# Patient Record
Sex: Male | Born: 1950
Health system: Southern US, Community
[De-identification: ages and names within clinical notes are randomized; demographics above are authoritative.]

## PROBLEM LIST (undated history)

## (undated) DIAGNOSIS — IMO0002 Reserved for concepts with insufficient information to code with codable children: Secondary | ICD-10-CM

## (undated) DIAGNOSIS — K219 Gastro-esophageal reflux disease without esophagitis: Secondary | ICD-10-CM

## (undated) DIAGNOSIS — M25552 Pain in left hip: Secondary | ICD-10-CM

## (undated) DIAGNOSIS — Z87442 Personal history of urinary calculi: Secondary | ICD-10-CM

## (undated) DIAGNOSIS — I700 Atherosclerosis of aorta: Secondary | ICD-10-CM

## (undated) DIAGNOSIS — K469 Unspecified abdominal hernia without obstruction or gangrene: Secondary | ICD-10-CM

## (undated) DIAGNOSIS — I10 Essential (primary) hypertension: Secondary | ICD-10-CM

## (undated) DIAGNOSIS — E785 Hyperlipidemia, unspecified: Secondary | ICD-10-CM

## (undated) DIAGNOSIS — N189 Chronic kidney disease, unspecified: Secondary | ICD-10-CM

## (undated) DIAGNOSIS — J45909 Unspecified asthma, uncomplicated: Secondary | ICD-10-CM

## (undated) DIAGNOSIS — Z8719 Personal history of other diseases of the digestive system: Secondary | ICD-10-CM

## (undated) DIAGNOSIS — K759 Inflammatory liver disease, unspecified: Secondary | ICD-10-CM

## (undated) DIAGNOSIS — I728 Aneurysm of other specified arteries: Secondary | ICD-10-CM

## (undated) HISTORY — PX: TONSILLECTOMY: SUR1361

## (undated) HISTORY — DX: Chronic kidney disease, unspecified: N18.9

## (undated) HISTORY — PX: UPPER GI ENDOSCOPY: SHX6162

## (undated) HISTORY — PX: OTHER SURGICAL HISTORY: SHX169

## (undated) HISTORY — PX: WISDOM TOOTH EXTRACTION: SHX21

## (undated) HISTORY — DX: Hyperlipidemia, unspecified: E78.5

## (undated) HISTORY — PX: UMBILICAL HERNIA REPAIR: SHX196

## (undated) HISTORY — PX: KNEE SURGERY: SHX244

## (undated) HISTORY — DX: Essential (primary) hypertension: I10

---

## 2015-12-07 DIAGNOSIS — E785 Hyperlipidemia, unspecified: Secondary | ICD-10-CM | POA: Diagnosis not present

## 2015-12-07 DIAGNOSIS — I129 Hypertensive chronic kidney disease with stage 1 through stage 4 chronic kidney disease, or unspecified chronic kidney disease: Secondary | ICD-10-CM | POA: Diagnosis not present

## 2015-12-07 DIAGNOSIS — N182 Chronic kidney disease, stage 2 (mild): Secondary | ICD-10-CM | POA: Diagnosis not present

## 2015-12-07 DIAGNOSIS — M25551 Pain in right hip: Secondary | ICD-10-CM | POA: Diagnosis not present

## 2015-12-07 DIAGNOSIS — J449 Chronic obstructive pulmonary disease, unspecified: Secondary | ICD-10-CM | POA: Diagnosis not present

## 2016-01-31 DIAGNOSIS — Z0000 Encounter for general adult medical examination without abnormal findings: Secondary | ICD-10-CM | POA: Diagnosis not present

## 2016-01-31 DIAGNOSIS — Z23 Encounter for immunization: Secondary | ICD-10-CM | POA: Diagnosis not present

## 2016-01-31 DIAGNOSIS — Z125 Encounter for screening for malignant neoplasm of prostate: Secondary | ICD-10-CM | POA: Diagnosis not present

## 2016-01-31 DIAGNOSIS — E785 Hyperlipidemia, unspecified: Secondary | ICD-10-CM | POA: Diagnosis not present

## 2016-01-31 DIAGNOSIS — I129 Hypertensive chronic kidney disease with stage 1 through stage 4 chronic kidney disease, or unspecified chronic kidney disease: Secondary | ICD-10-CM | POA: Diagnosis not present

## 2016-02-07 DIAGNOSIS — E785 Hyperlipidemia, unspecified: Secondary | ICD-10-CM | POA: Diagnosis not present

## 2016-02-07 DIAGNOSIS — J449 Chronic obstructive pulmonary disease, unspecified: Secondary | ICD-10-CM | POA: Diagnosis not present

## 2016-02-07 DIAGNOSIS — I129 Hypertensive chronic kidney disease with stage 1 through stage 4 chronic kidney disease, or unspecified chronic kidney disease: Secondary | ICD-10-CM | POA: Diagnosis not present

## 2016-02-07 DIAGNOSIS — N182 Chronic kidney disease, stage 2 (mild): Secondary | ICD-10-CM | POA: Diagnosis not present

## 2016-02-07 DIAGNOSIS — F17211 Nicotine dependence, cigarettes, in remission: Secondary | ICD-10-CM | POA: Diagnosis not present

## 2016-05-01 DIAGNOSIS — I129 Hypertensive chronic kidney disease with stage 1 through stage 4 chronic kidney disease, or unspecified chronic kidney disease: Secondary | ICD-10-CM | POA: Diagnosis not present

## 2016-05-01 DIAGNOSIS — E785 Hyperlipidemia, unspecified: Secondary | ICD-10-CM | POA: Diagnosis not present

## 2016-05-01 DIAGNOSIS — R7309 Other abnormal glucose: Secondary | ICD-10-CM | POA: Diagnosis not present

## 2016-05-08 DIAGNOSIS — J449 Chronic obstructive pulmonary disease, unspecified: Secondary | ICD-10-CM | POA: Diagnosis not present

## 2016-05-08 DIAGNOSIS — E785 Hyperlipidemia, unspecified: Secondary | ICD-10-CM | POA: Diagnosis not present

## 2016-05-08 DIAGNOSIS — F17211 Nicotine dependence, cigarettes, in remission: Secondary | ICD-10-CM | POA: Diagnosis not present

## 2016-05-08 DIAGNOSIS — N182 Chronic kidney disease, stage 2 (mild): Secondary | ICD-10-CM | POA: Diagnosis not present

## 2016-05-08 DIAGNOSIS — I129 Hypertensive chronic kidney disease with stage 1 through stage 4 chronic kidney disease, or unspecified chronic kidney disease: Secondary | ICD-10-CM | POA: Diagnosis not present

## 2016-08-08 DIAGNOSIS — E785 Hyperlipidemia, unspecified: Secondary | ICD-10-CM | POA: Diagnosis not present

## 2016-08-08 DIAGNOSIS — I129 Hypertensive chronic kidney disease with stage 1 through stage 4 chronic kidney disease, or unspecified chronic kidney disease: Secondary | ICD-10-CM | POA: Diagnosis not present

## 2016-08-20 DIAGNOSIS — E785 Hyperlipidemia, unspecified: Secondary | ICD-10-CM | POA: Diagnosis not present

## 2016-08-20 DIAGNOSIS — J449 Chronic obstructive pulmonary disease, unspecified: Secondary | ICD-10-CM | POA: Diagnosis not present

## 2016-08-20 DIAGNOSIS — I129 Hypertensive chronic kidney disease with stage 1 through stage 4 chronic kidney disease, or unspecified chronic kidney disease: Secondary | ICD-10-CM | POA: Diagnosis not present

## 2016-08-20 DIAGNOSIS — N182 Chronic kidney disease, stage 2 (mild): Secondary | ICD-10-CM | POA: Diagnosis not present

## 2016-08-20 DIAGNOSIS — F17211 Nicotine dependence, cigarettes, in remission: Secondary | ICD-10-CM | POA: Diagnosis not present

## 2016-11-13 DIAGNOSIS — I129 Hypertensive chronic kidney disease with stage 1 through stage 4 chronic kidney disease, or unspecified chronic kidney disease: Secondary | ICD-10-CM | POA: Diagnosis not present

## 2016-11-20 DIAGNOSIS — J449 Chronic obstructive pulmonary disease, unspecified: Secondary | ICD-10-CM | POA: Diagnosis not present

## 2016-11-20 DIAGNOSIS — E785 Hyperlipidemia, unspecified: Secondary | ICD-10-CM | POA: Diagnosis not present

## 2016-11-20 DIAGNOSIS — I129 Hypertensive chronic kidney disease with stage 1 through stage 4 chronic kidney disease, or unspecified chronic kidney disease: Secondary | ICD-10-CM | POA: Diagnosis not present

## 2016-11-20 DIAGNOSIS — K219 Gastro-esophageal reflux disease without esophagitis: Secondary | ICD-10-CM | POA: Diagnosis not present

## 2016-11-20 DIAGNOSIS — Z23 Encounter for immunization: Secondary | ICD-10-CM | POA: Diagnosis not present

## 2017-06-05 DIAGNOSIS — E785 Hyperlipidemia, unspecified: Secondary | ICD-10-CM | POA: Diagnosis not present

## 2017-06-05 DIAGNOSIS — Z0000 Encounter for general adult medical examination without abnormal findings: Secondary | ICD-10-CM | POA: Diagnosis not present

## 2017-06-05 DIAGNOSIS — I129 Hypertensive chronic kidney disease with stage 1 through stage 4 chronic kidney disease, or unspecified chronic kidney disease: Secondary | ICD-10-CM | POA: Diagnosis not present

## 2017-06-05 DIAGNOSIS — Z129 Encounter for screening for malignant neoplasm, site unspecified: Secondary | ICD-10-CM | POA: Diagnosis not present

## 2017-06-11 DIAGNOSIS — K219 Gastro-esophageal reflux disease without esophagitis: Secondary | ICD-10-CM | POA: Diagnosis not present

## 2017-06-11 DIAGNOSIS — J449 Chronic obstructive pulmonary disease, unspecified: Secondary | ICD-10-CM | POA: Diagnosis not present

## 2017-06-11 DIAGNOSIS — N182 Chronic kidney disease, stage 2 (mild): Secondary | ICD-10-CM | POA: Diagnosis not present

## 2017-06-11 DIAGNOSIS — E785 Hyperlipidemia, unspecified: Secondary | ICD-10-CM | POA: Diagnosis not present

## 2017-09-30 DIAGNOSIS — Z23 Encounter for immunization: Secondary | ICD-10-CM | POA: Diagnosis not present

## 2017-11-11 DIAGNOSIS — D485 Neoplasm of uncertain behavior of skin: Secondary | ICD-10-CM | POA: Diagnosis not present

## 2017-11-11 DIAGNOSIS — L918 Other hypertrophic disorders of the skin: Secondary | ICD-10-CM | POA: Diagnosis not present

## 2017-11-11 DIAGNOSIS — L853 Xerosis cutis: Secondary | ICD-10-CM | POA: Diagnosis not present

## 2017-11-11 DIAGNOSIS — C44329 Squamous cell carcinoma of skin of other parts of face: Secondary | ICD-10-CM | POA: Diagnosis not present

## 2017-11-11 DIAGNOSIS — L821 Other seborrheic keratosis: Secondary | ICD-10-CM | POA: Diagnosis not present

## 2017-11-11 DIAGNOSIS — L570 Actinic keratosis: Secondary | ICD-10-CM | POA: Diagnosis not present

## 2017-11-11 DIAGNOSIS — D1801 Hemangioma of skin and subcutaneous tissue: Secondary | ICD-10-CM | POA: Diagnosis not present

## 2017-12-04 DIAGNOSIS — E785 Hyperlipidemia, unspecified: Secondary | ICD-10-CM | POA: Diagnosis not present

## 2017-12-04 DIAGNOSIS — N182 Chronic kidney disease, stage 2 (mild): Secondary | ICD-10-CM | POA: Diagnosis not present

## 2017-12-06 DIAGNOSIS — Z85828 Personal history of other malignant neoplasm of skin: Secondary | ICD-10-CM | POA: Diagnosis not present

## 2017-12-06 DIAGNOSIS — C44329 Squamous cell carcinoma of skin of other parts of face: Secondary | ICD-10-CM | POA: Diagnosis not present

## 2017-12-06 HISTORY — PX: SQUAMOUS CELL CARCINOMA EXCISION: SHX2433

## 2017-12-11 ENCOUNTER — Encounter: Payer: Self-pay | Admitting: Gastroenterology

## 2017-12-11 DIAGNOSIS — K649 Unspecified hemorrhoids: Secondary | ICD-10-CM | POA: Diagnosis not present

## 2017-12-11 DIAGNOSIS — I10 Essential (primary) hypertension: Secondary | ICD-10-CM | POA: Diagnosis not present

## 2017-12-11 DIAGNOSIS — E7800 Pure hypercholesterolemia, unspecified: Secondary | ICD-10-CM | POA: Diagnosis not present

## 2017-12-11 DIAGNOSIS — Z23 Encounter for immunization: Secondary | ICD-10-CM | POA: Diagnosis not present

## 2017-12-11 DIAGNOSIS — R7301 Impaired fasting glucose: Secondary | ICD-10-CM | POA: Diagnosis not present

## 2017-12-11 DIAGNOSIS — Z7189 Other specified counseling: Secondary | ICD-10-CM | POA: Diagnosis not present

## 2018-01-10 DIAGNOSIS — Z23 Encounter for immunization: Secondary | ICD-10-CM | POA: Diagnosis not present

## 2018-01-21 DIAGNOSIS — J019 Acute sinusitis, unspecified: Secondary | ICD-10-CM | POA: Diagnosis not present

## 2018-01-21 NOTE — Progress Notes (Signed)
New patient paperwork 

## 2018-01-29 ENCOUNTER — Encounter: Payer: Self-pay | Admitting: Gastroenterology

## 2018-01-29 ENCOUNTER — Ambulatory Visit (INDEPENDENT_AMBULATORY_CARE_PROVIDER_SITE_OTHER): Payer: Medicare Other | Admitting: Gastroenterology

## 2018-01-29 ENCOUNTER — Encounter (INDEPENDENT_AMBULATORY_CARE_PROVIDER_SITE_OTHER): Payer: Self-pay

## 2018-01-29 VITALS — BP 124/80 | HR 72 | Ht 71.75 in | Wt 261.2 lb

## 2018-01-29 DIAGNOSIS — R194 Change in bowel habit: Secondary | ICD-10-CM

## 2018-01-29 DIAGNOSIS — K625 Hemorrhage of anus and rectum: Secondary | ICD-10-CM | POA: Diagnosis not present

## 2018-01-29 DIAGNOSIS — K648 Other hemorrhoids: Secondary | ICD-10-CM

## 2018-01-29 DIAGNOSIS — K219 Gastro-esophageal reflux disease without esophagitis: Secondary | ICD-10-CM

## 2018-01-29 MED ORDER — NA SULFATE-K SULFATE-MG SULF 17.5-3.13-1.6 GM/177ML PO SOLN: 1.0000 | Freq: Once | ORAL | 0 refills | Status: AC

## 2018-01-29 NOTE — Progress Notes (Signed)
HPI :  67 y/o male with a history of reactive airway disease / COPD, CKD, HTN, HLD,, GERD, referred here by Deland Pretty MD for new patient visit for blood in the stools.   The patient states he had a thrombosed external hemorrhoid which was extremely painful and ruptured blood, roughly 2-1/2 years ago.  Since that time he states he has been having intermittent rectal bleeding.  He states there is no specific pattern to the bleeding, it can occur every day for a few days and then he will not have it for a period of time.  He denies any clear triggers for it.  He denies any perianal pain or discomfort with this.  He states his bowels were previously regular, but now he has irregular bowel habits, he states he does not feel he can completely evacuate himself and has smaller volume stools, multiple times per day.  The bowel symptoms have been going on for several months.  He has some occasional right lower quadrant pain at times but is fleeting and thought to be related to his hip.  He denies any weight loss.  He denies any family history of colon cancer.  He did have a colonoscopy at age 89 which she states was normal, and then had another colonoscopy at age 74 which she states was also normal.  Both of these were done in the [place] area.  No records are available for this.  He does have a history of reflux for which he takes Nexium 20 mg once per day.  He states this controls his reflux very well and he does not have any dysphasia or breakthrough symptoms he did have a prior EGD in his 75s which he states was normal, he has no reported history of Barrett's esophagus.   Past Medical History:  Diagnosis Date  . COPD (chronic obstructive pulmonary disease) ([place])   . CRF (chronic renal failure)    Stage 2  . Hyperlipidemia   . Hypertension      Past Surgical History:  Procedure Laterality Date  . KNEE SURGERY Left    tumor removed  . SQUAMOUS CELL CARCINOMA EXCISION  12/06/2017   removal- left temple  . TONSILLECTOMY N/A   . UMBILICAL HERNIA REPAIR     Family History  Problem Relation Age of Onset  . Heart disease Father    Social History   Tobacco Use  . Smoking status: Former Research scientist (life sciences)  . Smokeless tobacco: Never Used  Substance Use Topics  . Alcohol use: Yes    Comment: 2 to 3 a week  . Drug use: No   Current Outpatient Medications  Medication Sig Dispense Refill  . albuterol (PROVENTIL HFA;VENTOLIN HFA) 108 (90 Base) MCG/ACT inhaler Inhale 2 puffs into the lungs every 4 (four) hours as needed for wheezing or shortness of breath.    . doxycycline (DORYX) 100 MG EC tablet Take 100 mg by mouth daily.    Marland Kitchen esomeprazole (NEXIUM 24HR) 20 MG capsule Take 20 mg by mouth daily at 12 noon.    . ezetimibe (ZETIA) 10 MG tablet Take 10 mg by mouth daily.    . hydrochlorothiazide (HYDRODIURIL) 25 MG tablet Take 25 mg by mouth daily.    . naproxen sodium (ALEVE) 220 MG tablet Take 220 mg by mouth as needed.     No current facility-administered medications for this visit.    Allergies  Allergen Reactions  . Atorvastatin Calcium Anaphylaxis    Memory loss  .  Influenza Vac Split Quad   . Penicillin V Potassium      Review of Systems: All systems reviewed and negative except where noted in HPI.   Labs from primary care obtained:  12/04/2017 - Hgb 16.7, MCV 94, WBC 6.9, plt 222 Normal LFTS and renal function  Physical Exam: BP 124/80   Pulse 72   Ht 5' 11.75" (1.822 m)   Wt 261 lb 4 oz (118.5 kg)   BMI 35.68 kg/m  Constitutional: Pleasant,well-developed, male in no acute distress. HEENT: Normocephalic and atraumatic. Conjunctivae are normal. No scleral icterus. Neck supple.  Cardiovascular: Normal rate, regular rhythm.  Pulmonary/chest: Effort normal and breath sounds normal. No wheezing, rales or rhonchi. Abdominal: Soft, nondistended, nontender.There are no masses palpable. No hepatomegaly. DRE / Anoscopy - no mass lesions, inflamed LL internal  hemorrhoids Extremities: no edema Lymphadenopathy: No cervical adenopathy noted. Neurological: Alert and oriented to person place and time. Skin: Skin is warm and dry. No rashes noted. Psychiatric: Normal mood and affect. Behavior is normal.   ASSESSMENT AND PLAN: 67 year old male with history as outlined above, referred for intermittent rectal bleeding associated with change in bowel habits.  His DRE and anoscopy shows an inflamed moderate sized left lateral hemorrhoid which I suspect is the likely etiology for his bleeding.  However given his reported change in bowel habits I think a colonoscopy is reasonable at this time given his last exam was 7 years ago, to ensure no bleeding polyp or mass lesion.  If the colonoscopy is unrevealing for another cause for his symptoms then I offered him a hemorrhoid banding to treat the hemorrhoids.  Following a discussion of the risks and benefits of colonoscopy he wanted to proceed as outlined.  I did offer to band the hemorrhoid today, but we elected to await colonoscopy results as hemorrhoids are very common and I want to exclude other etiologies first.  He agreed.  Recommend he is a daily fiber supplement in the interim to regulate his bowels.  He has no anemia on review of labs which is reassuring.  He otherwise will continue current dose of Nexium as it controls symptoms well, he has no history of Barrett's esophagus  Florence Cellar, MD [facility] Pager [phone]  CC: Deland Pretty, MD

## 2018-01-29 NOTE — Patient Instructions (Addendum)
If you are age 68 or older, your body mass index should be between 23-30. Your Body mass index is 35.68 kg/m. If this is out of the aforementioned range listed, please consider follow up with your Primary Care Provider.  If you are age 57 or younger, your body mass index should be between 19-25. Your Body mass index is 35.68 kg/m. If this is out of the aformentioned range listed, please consider follow up with your Primary Care Provider.   You have been scheduled for a colonoscopy. Please follow written instructions given to you at your visit today.  Please pick up your prep supplies at the pharmacy within the next 1-3 days. If you use inhalers (even only as needed), please bring them with you on the day of your procedure. Your physician has requested that you go to www.startemmi.com and enter the access code given to you at your visit today. This web site gives a general overview about your procedure. However, you should still follow specific instructions given to you by our office regarding your preparation for the procedure.  Please begin taking a daily fiber supplement.   We will try to obtain your lab records from Dr. Thressa Sheller.  Thank you for entrusting me with your care and for choosing [facility], Dr. Estancia Cellar

## 2018-03-06 ENCOUNTER — Encounter: Payer: Self-pay | Admitting: Gastroenterology

## 2018-03-06 ENCOUNTER — Ambulatory Visit (AMBULATORY_SURGERY_CENTER): Payer: Medicare Other | Admitting: Gastroenterology

## 2018-03-06 VITALS — BP 100/64 | HR 82 | Temp 97.3°F | Resp 13 | Ht 71.0 in | Wt 261.0 lb

## 2018-03-06 DIAGNOSIS — D120 Benign neoplasm of cecum: Secondary | ICD-10-CM | POA: Diagnosis not present

## 2018-03-06 DIAGNOSIS — D126 Benign neoplasm of colon, unspecified: Secondary | ICD-10-CM

## 2018-03-06 DIAGNOSIS — R194 Change in bowel habit: Secondary | ICD-10-CM | POA: Diagnosis not present

## 2018-03-06 DIAGNOSIS — K635 Polyp of colon: Secondary | ICD-10-CM

## 2018-03-06 DIAGNOSIS — D125 Benign neoplasm of sigmoid colon: Secondary | ICD-10-CM | POA: Diagnosis not present

## 2018-03-06 DIAGNOSIS — J45909 Unspecified asthma, uncomplicated: Secondary | ICD-10-CM | POA: Diagnosis not present

## 2018-03-06 DIAGNOSIS — K625 Hemorrhage of anus and rectum: Secondary | ICD-10-CM | POA: Diagnosis present

## 2018-03-06 HISTORY — PX: COLONOSCOPY W/ POLYPECTOMY: SHX1380

## 2018-03-06 MED ORDER — SODIUM CHLORIDE 0.9 % IV SOLN: 500.0000 mL | Freq: Once | INTRAVENOUS | Status: AC

## 2018-03-06 NOTE — Patient Instructions (Signed)
Impression/recommendations:  Polyps (handout given) Diverticulosis (handout given) Hemorrhoids (handout given)  YOU HAD AN ENDOSCOPIC PROCEDURE TODAY AT THE [facility]:   Refer to the procedure report that was given to you for any specific questions about what was found during the examination.  If the procedure report does not answer your questions, please call your gastroenterologist to clarify.  If you requested that your care partner not be given the details of your procedure findings, then the procedure report has been included in a sealed envelope for you to review at your convenience later.  YOU SHOULD EXPECT: Some feelings of bloating in the abdomen. Passage of more gas than usual.  Walking can help get rid of the air that was put into your GI tract during the procedure and reduce the bloating. If you had a lower endoscopy (such as a colonoscopy or flexible sigmoidoscopy) you may notice spotting of blood in your stool or on the toilet paper. If you underwent a bowel prep for your procedure, you may not have a normal bowel movement for a few days.  Please Note:  You might notice some irritation and congestion in your nose or some drainage.  This is from the oxygen used during your procedure.  There is no need for concern and it should clear up in a day or so.  SYMPTOMS TO REPORT IMMEDIATELY:   Following lower endoscopy (colonoscopy or flexible sigmoidoscopy):  Excessive amounts of blood in the stool  Significant tenderness or worsening of abdominal pains  Swelling of the abdomen that is new, acute  Fever of 100F or higher  For urgent or emergent issues, a gastroenterologist can be reached at any hour by calling [phone].   DIET:  We do recommend a small meal at first, but then you may proceed to your regular diet.  Drink plenty of fluids but you should avoid alcoholic beverages for 24 hours.  ACTIVITY:  You should plan to take it easy for the rest of today and you  should NOT DRIVE or use heavy machinery until tomorrow (because of the sedation medicines used during the test).    FOLLOW UP: Our staff will call the number listed on your records the next business day following your procedure to check on you and address any questions or concerns that you may have regarding the information given to you following your procedure. If we do not reach you, we will leave a message.  However, if you are feeling well and you are not experiencing any problems, there is no need to return our call.  We will assume that you have returned to your regular daily activities without incident.  If any biopsies were taken you will be contacted by phone or by letter within the next 1-3 weeks.  Please call us at [phone] if you have not heard about the biopsies in 3 weeks.    SIGNATURES/CONFIDENTIALITY: You and/or your care partner have signed paperwork which will be entered into your electronic medical record.  These signatures attest to the fact that that the information above on your After Visit Summary has been reviewed and is understood.  Full responsibility of the confidentiality of this discharge information lies with you and/or your care-partner. 

## 2018-03-06 NOTE — Progress Notes (Signed)
Called to room to assist during endoscopic procedure.  Patient ID and intended procedure confirmed with present staff. Received instructions for my participation in the procedure from the performing physician.  

## 2018-03-06 NOTE — Op Note (Signed)
Center Patient Name: Theodore Brooks Procedure Date: 03/06/2018 10:57 AM MRN: [id number] Endoscopist: Remo Lipps P. Armbruster MD, MD Age: 67 Referring MD:  Date of Birth: Feb 02, 1951 Gender: Male Account #: [id number] Procedure:                Colonoscopy Indications:              Hematochezia, Change in bowel habits Medicines:                Monitored Anesthesia Care Procedure:                Pre-Anesthesia Assessment:                           - Prior to the procedure, a History and Physical                            was performed, and patient medications and                            allergies were reviewed. The patient's tolerance of                            previous anesthesia was also reviewed. The risks                            and benefits of the procedure and the sedation                            options and risks were discussed with the patient.                            All questions were answered, and informed consent                            was obtained. Prior Anticoagulants: The patient has                            taken no previous anticoagulant or antiplatelet                            agents. ASA Grade Assessment: III - A patient with                            severe systemic disease. After reviewing the risks                            and benefits, the patient was deemed in                            satisfactory condition to undergo the procedure.                           After obtaining informed consent, the colonoscope  was passed under direct vision. Throughout the                            procedure, the patient's blood pressure, pulse, and                            oxygen saturations were monitored continuously. The                            Colonoscope was introduced through the anus and                            advanced to the the terminal ileum, with                            identification of the  appendiceal orifice and IC                            valve. The colonoscopy was performed without                            difficulty. The patient tolerated the procedure                            well. The quality of the bowel preparation was                            good. The terminal ileum, ileocecal valve,                            appendiceal orifice, and rectum were photographed. Scope In: 11:11:23 AM Scope Out: 11:27:13 AM Scope Withdrawal Time: 0 hours 13 minutes 56 seconds  Total Procedure Duration: 0 hours 15 minutes 50 seconds  Findings:                 The perianal and digital rectal examinations were                            normal.                           The terminal ileum appeared normal.                           Two sessile polyps were found in the cecum. The                            polyps were 3 to 4 mm in size. These polyps were                            removed with a cold snare. Resection and retrieval                            were complete.  A 5 mm polyp was found in the sigmoid colon. The                            polyp was sessile. The polyp was removed with a                            cold snare. Resection and retrieval were complete.                           Scattered medium-mouthed diverticula were found in                            the transverse colon and left colon.                           Internal hemorrhoids were found during                            retroflexion. The hemorrhoids were moderate.                           The exam was otherwise without abnormality. Of                            note, the IC valve was normal but photo                            accidentally not obtained. Complications:            No immediate complications. Estimated blood loss:                            Minimal. Estimated Blood Loss:     Estimated blood loss was minimal. Impression:               - The examined portion of  the ileum was normal.                           - Two 3 to 4 mm polyps in the cecum, removed with a                            cold snare. Resected and retrieved.                           - One 5 mm polyp in the sigmoid colon, removed with                            a cold snare. Resected and retrieved.                           - Diverticulosis in the transverse colon and in the                            left colon.                           -  Internal hemorrhoids.                           - The examination was otherwise normal.                           Hemorrhoids are the cause of the bleeding symptoms Recommendation:           - Patient has a contact number available for                            emergencies. The signs and symptoms of potential                            delayed complications were discussed with the                            patient. Return to normal activities tomorrow.                            Written discharge instructions were provided to the                            patient.                           - Resume previous diet.                           - Continue present medications.                           - Await pathology results.                           - Repeat colonoscopy is recommended for                            surveillance. The colonoscopy date will be                            determined after pathology results from today's                            exam become available for review.                           - Consideration for hemorrhoid banding if symptoms                            of bleeding persist. Remo Lipps P. Armbruster MD, MD 03/06/2018 11:33:02 AM This report has been signed electronically.

## 2018-03-06 NOTE — Progress Notes (Signed)
Pt's states no medical or surgical changes since previsit or office visit. 

## 2018-03-06 NOTE — Progress Notes (Signed)
Report given to PACU, vss 

## 2018-03-07 ENCOUNTER — Telehealth: Payer: Self-pay

## 2018-03-07 ENCOUNTER — Telehealth: Payer: Self-pay | Admitting: *Deleted

## 2018-03-07 ENCOUNTER — Other Ambulatory Visit: Payer: Self-pay

## 2018-03-07 DIAGNOSIS — R1031 Right lower quadrant pain: Secondary | ICD-10-CM

## 2018-03-07 NOTE — Telephone Encounter (Signed)
Patient advised, scheduled for CT and lab work. He will come by and pick up prep/instructions early next week and get labs done. He wants to wait on hemorrhoid banding and will call back at a later date.

## 2018-03-07 NOTE — Telephone Encounter (Signed)
-----   Message from Yetta Flock, MD sent at 03/06/2018 12:16 PM EST ----- Regarding: last one - follow up testing Almyra Free can you help coordinate the following for this patient, I saw him during colonoscopy today:  - CT abdomen / pelvis with contrast for RLQ pain. He will probably need a BMET on file to ensure normal - Can you schedule him for a hemorrhoid banding as well.   Thanks Dr. Loni Muse

## 2018-03-07 NOTE — Telephone Encounter (Signed)
  Follow up Call-  Call back number 03/06/2018  Post procedure Call Back phone  # [phone]  Permission to leave phone message Yes     Patient questions:  Do you have a fever, pain , or abdominal swelling? No. Pain Score  0 *  Have you tolerated food without any problems? Yes.    Have you been able to return to your normal activities? Yes.    Do you have any questions about your discharge instructions: Diet   No. Medications  No. Follow up visit  No.  Do you have questions or concerns about your Care? No.  Actions: * If pain score is 4 or above: No action needed, pain <4.

## 2018-03-10 ENCOUNTER — Other Ambulatory Visit (INDEPENDENT_AMBULATORY_CARE_PROVIDER_SITE_OTHER): Payer: Medicare Other

## 2018-03-10 DIAGNOSIS — R1031 Right lower quadrant pain: Secondary | ICD-10-CM | POA: Diagnosis not present

## 2018-03-10 LAB — BASIC METABOLIC PANEL
BUN: 16 mg/dL (ref 6–23)
CO2: 26 mEq/L (ref 19–32)
Calcium: 9.6 mg/dL (ref 8.4–10.5)
Chloride: 100 mEq/L (ref 96–112)
Creatinine, Ser: 1.07 mg/dL (ref 0.40–1.50)
GFR: 73.14 mL/min (ref >60.00)
GLUCOSE: 93 mg/dL (ref 70–99)
POTASSIUM: 3.7 meq/L (ref 3.5–5.1)
Sodium: 136 mEq/L (ref 135–145)

## 2018-03-12 ENCOUNTER — Encounter: Payer: Self-pay | Admitting: Gastroenterology

## 2018-03-12 ENCOUNTER — Telehealth: Payer: Self-pay | Admitting: Gastroenterology

## 2018-03-12 NOTE — Telephone Encounter (Signed)
Called patient back and gave him the number to radiology scheduling so he can get CT rescheduled at his convenience. He has already done lab work and picked up the prep/instructions.

## 2018-03-14 ENCOUNTER — Ambulatory Visit (HOSPITAL_COMMUNITY): Payer: Medicare Other

## 2018-03-17 ENCOUNTER — Encounter (HOSPITAL_COMMUNITY): Payer: Self-pay

## 2018-03-17 ENCOUNTER — Ambulatory Visit (HOSPITAL_COMMUNITY)
Admission: RE | Admit: 2018-03-17 | Discharge: 2018-03-17 | Disposition: A | Discharge status: Home / Self Care | Payer: Medicare Other | Source: Ambulatory Visit | Attending: Gastroenterology | Admitting: Gastroenterology

## 2018-03-17 DIAGNOSIS — R9389 Abnormal findings on diagnostic imaging of other specified body structures: Secondary | ICD-10-CM | POA: Diagnosis not present

## 2018-03-17 DIAGNOSIS — I728 Aneurysm of other specified arteries: Secondary | ICD-10-CM | POA: Insufficient documentation

## 2018-03-17 DIAGNOSIS — I700 Atherosclerosis of aorta: Secondary | ICD-10-CM | POA: Insufficient documentation

## 2018-03-17 DIAGNOSIS — R1031 Right lower quadrant pain: Secondary | ICD-10-CM | POA: Diagnosis present

## 2018-03-17 DIAGNOSIS — K449 Diaphragmatic hernia without obstruction or gangrene: Secondary | ICD-10-CM | POA: Diagnosis not present

## 2018-03-17 DIAGNOSIS — K439 Ventral hernia without obstruction or gangrene: Secondary | ICD-10-CM | POA: Diagnosis not present

## 2018-03-17 HISTORY — DX: Aneurysm of other specified arteries: I72.8

## 2018-03-17 MED ORDER — IOPAMIDOL (ISOVUE-300) INJECTION 61%
INTRAVENOUS | Status: AC
  Administered 2018-03-17: 100 mL via INTRAVENOUS
  Filled 2018-03-17: qty 100

## 2018-03-17 MED ORDER — IOPAMIDOL (ISOVUE-300) INJECTION 61%
100.0000 mL | Freq: Once | INTRAVENOUS | Status: AC | PRN
  Administered 2018-03-17: 100 mL via INTRAVENOUS

## 2018-03-21 ENCOUNTER — Telehealth: Payer: Self-pay | Admitting: Gastroenterology

## 2018-03-21 NOTE — Telephone Encounter (Signed)
See result note. Referral faxed to [facility].

## 2018-04-07 ENCOUNTER — Telehealth: Payer: Self-pay | Admitting: Gastroenterology

## 2018-04-07 NOTE — Telephone Encounter (Signed)
Left message for [facility] to call back.

## 2018-04-07 NOTE — Telephone Encounter (Signed)
[facility] called back and state that pt will be seeing Dr. Lubertha South with [facility] 04/14/18@3 :10pm. Asked if I needed to contact pt regarding appt and was told I did not need to.

## 2018-04-07 NOTE — Telephone Encounter (Signed)
Patient states per CT results Dr.Armbruster was going to refer him to [facility].Patient states he has not heard from them and would like to know what to do.

## 2018-04-14 ENCOUNTER — Ambulatory Visit: Payer: Self-pay | Admitting: General Surgery

## 2018-04-14 DIAGNOSIS — K439 Ventral hernia without obstruction or gangrene: Secondary | ICD-10-CM | POA: Diagnosis not present

## 2018-04-14 NOTE — H&P (Signed)
History of Present Illness Theodore Brooks; 04/14/2018 3:25 PM) The patient is a 67 year old male who presents with an abdominal wall hernia. Referred by: Dr. [place] Cellar Chief Complaint: Hernia  Patient is a 67 year old male comes in with a history of hyperlipidemia, hypertension with a right lower quadrant abdominal pain. Patient states is "over several months. He states that the pain is classified as discomfort to the right lower quadrant. He states he notices it more when he is lifting for working out. He is working out consists of 2-5 miles walking. He states at that time he notices some tightening to the right lower quadrant area. Patient recently underwent colonoscopy which revealed hemorrhoids however no further findings. He underwent CT scan and was found to have a right-sided spigelian hernia with portion of the appendix and fat in the area. I did review the study personally.  Patient's had no previous abdominal surgeries aside from an open umbilical hernia repair with mesh.    Past Surgical History Sabino Gasser; 04/14/2018 3:07 PM) Tonsillectomy   Diagnostic Studies History Sabino Gasser; 04/14/2018 3:07 PM) Colonoscopy  within last year  Allergies Sabino Gasser; 04/14/2018 3:04 PM) Penicillins  Allergies Reconciled   Medication History Sabino Gasser; 04/14/2018 3:05 PM) Robafen AC (100-10MG /5ML Solution, Oral) Active. Doxycycline Hyclate (100MG  Capsule, Oral) Active. Ezetimibe (10MG  Tablet, Oral) Active. HydroCHLOROthiazide (25MG  Tablet, Oral) Active. Ciprofloxacin HCl (500MG  Tablet, Oral) Active. Medications Reconciled  Social History Sabino Gasser; 04/14/2018 3:07 PM) Alcohol use  Moderate alcohol use. Caffeine use  Coffee. Illicit drug use  Remotely quit drug use. Tobacco use  Former smoker.  Family History Sabino Gasser; 04/14/2018 3:07 PM) Diabetes Mellitus  Father. Heart Disease  Father.  Other Problems Sabino Gasser;  04/14/2018 3:07 PM) Asthma  Gastroesophageal Reflux Disease  High blood pressure  Hypercholesterolemia  Kidney Stone  Umbilical Hernia Repair     Review of Systems Theodore Brooks; 04/14/2018 3:22 PM) General Not Present- Appetite Loss, Chills, Fatigue, Fever, Night Sweats, Weight Gain and Weight Loss. Skin Not Present- Change in Wart/Mole, Dryness, Hives, Jaundice, New Lesions, Non-Healing Wounds, Rash and Ulcer. HEENT Present- Seasonal Allergies. Not Present- Earache, Hearing Loss, Hoarseness, Nose Bleed, Oral Ulcers, Ringing in the Ears, Sinus Pain, Sore Throat, Visual Disturbances, Wears glasses/contact lenses and Yellow Eyes. Respiratory Not Present- Bloody sputum, Chronic Cough, Difficulty Breathing, Snoring and Wheezing. Breast Not Present- Breast Mass, Breast Pain, Nipple Discharge and Skin Changes. Cardiovascular Not Present- Chest Pain, Difficulty Breathing Lying Down, Leg Cramps, Palpitations, Rapid Heart Rate, Shortness of Breath and Swelling of Extremities. Gastrointestinal Present- Bloody Stool and Hemorrhoids. Not Present- Abdominal Pain, Bloating, Change in Bowel Habits, Chronic diarrhea, Constipation, Difficulty Swallowing, Excessive gas, Gets full quickly at meals, Indigestion, Nausea, Rectal Pain and Vomiting. Male Genitourinary Not Present- Blood in Urine, Change in Urinary Stream, Frequency, Impotence, Nocturia, Painful Urination, Urgency and Urine Leakage. Musculoskeletal Not Present- Back Pain, Joint Pain, Joint Stiffness, Muscle Pain, Muscle Weakness and Swelling of Extremities. Neurological Not Present- Decreased Memory, Fainting, Headaches, Numbness, Seizures, Tingling, Tremor, Trouble walking and Weakness. Psychiatric Not Present- Anxiety, Bipolar, Change in Sleep Pattern, Depression, Fearful and Frequent crying. Endocrine Not Present- Cold Intolerance, Excessive Hunger, Hair Changes, Heat Intolerance, Hot flashes and New Diabetes. Hematology Not Present-  Blood Thinners, Easy Bruising, Excessive bleeding, Gland problems, HIV and Persistent Infections. All other systems negative  Vitals Sabino Gasser; 04/14/2018 3:06 PM) 04/14/2018 3:05 PM Weight: 256.25 lb Height: 72in Body Surface Area: 2.37 m Body Mass Index: 34.75 kg/m  Temp.:  98.30F(Oral)  Pulse: 102 (Regular)  BP: 132/80 (Sitting, Left Arm, Standard)       Physical Exam Theodore Brooks; 04/14/2018 3:25 PM) The physical exam findings are as follows: Note:Constitutional: No acute distress, conversant, appears stated age  Eyes: Anicteric sclerae, moist conjunctiva, no lid lag  Neck: No thyromegaly, trachea midline, no cervical lymphadenopathy  Lungs: Clear to auscultation biilaterally, normal respiratory effot  Cardiovascular: regular rate & rhythm, no murmurs, no peripheal edema, pedal pulses 2+  GI: Soft, no masses or hepatosplenomegaly, non-tender to palpation  MSK: Normal gait, no clubbing cyanosis, edema  Skin: No rashes, palpation reveals normal skin turgor  Psychiatric: Appropriate judgment and insight, oriented to person, place, and time  Abdomen Inspection Hernias - Ventral - Reducible(Right lower quadrant spigelian).    Assessment & Plan Theodore Brooks; 04/14/2018 3:26 PM) VENTRAL HERNIA WITHOUT OBSTRUCTION OR GANGRENE (K43.9) Impression: 67 year old male with a history of hypertension, hyperlipidemia, with a right lower quadrant spigelian hernia.  1. The patient will like to proceed to the operating room for laparoscopic/robotic spigelian hernia repair with mesh, possible appendectomy  2. I discussed with the patient the signs and symptoms of incarceration and strangulation and the need to proceed to the ER should they occur.  3. I discussed with the patient the risks and benefits of the procedure to include but not limited to: Infection, bleeding, damage to surrounding structures, possible need for further surgery, possible nerve  pain, and possible recurrence. The patient was understanding and wishes to proceed.

## 2018-05-23 ENCOUNTER — Encounter

## 2018-05-23 ENCOUNTER — Ambulatory Visit (INDEPENDENT_AMBULATORY_CARE_PROVIDER_SITE_OTHER): Payer: Medicare Other | Admitting: Vascular Surgery

## 2018-05-23 ENCOUNTER — Encounter: Payer: Self-pay | Admitting: Vascular Surgery

## 2018-05-23 VITALS — BP 123/82 | HR 76 | Resp 20 | Ht 71.0 in | Wt 261.8 lb

## 2018-05-23 DIAGNOSIS — I728 Aneurysm of other specified arteries: Secondary | ICD-10-CM

## 2018-05-23 NOTE — Progress Notes (Signed)
Patient ID: Theodore Brooks, male   DOB: 07/05/1951, 67 y.o.   MRN: [id number]  Reason for Consult: New Patient (Initial Visit) (eval aneurysm celiac trunk - Dr. Ralene Ok)   Referred by Merrilee Seashore, MD  Subjective:     HPI:  Theodore Brooks is a 67 y.o. male noted to have an abdominal hernia after lifting weights.  He is now scheduled for surgery on that.  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  He has a family history of a cerebral aneurysm but no abdominal or peripheral aneurysms.  He is a non-smoker.  Risk factors include hyperlipidemia hypertension.  He does not have any abdominal pain or limitation to eating.  He does not take blood thinners.  He has no constitutional symptoms.  Past Medical History:  Diagnosis Date  . COPD (chronic obstructive pulmonary disease) ([place])   . CRF (chronic renal failure)    Stage 2  . Hyperlipidemia   . Hypertension    Family History  Problem Relation Age of Onset  . Heart disease Father    Past Surgical History:  Procedure Laterality Date  . KNEE SURGERY Left    tumor removed  . SQUAMOUS CELL CARCINOMA EXCISION  12/06/2017   removal- left temple  . TONSILLECTOMY N/A   . UMBILICAL HERNIA REPAIR      Short Social History:  Social History   Tobacco Use  . Smoking status: Former Research scientist (life sciences)  . Smokeless tobacco: Never Used  Substance Use Topics  . Alcohol use: Yes    Comment: 2 to 3 a week    Allergies  Allergen Reactions  . Atorvastatin Calcium Anaphylaxis    Memory loss  . Influenza Vac Split Quad   . Penicillin V Potassium     Current Outpatient Medications  Medication Sig Dispense Refill  . albuterol (PROVENTIL HFA;VENTOLIN HFA) 108 (90 Base) MCG/ACT inhaler Inhale 2 puffs into the lungs every 4 (four) hours as needed for wheezing or shortness of breath.    . esomeprazole (NEXIUM 24HR) 20 MG capsule Take 20 mg by mouth daily at 12 noon.    . ezetimibe (ZETIA) 10 MG tablet  Take 10 mg by mouth daily.    . hydrochlorothiazide (HYDRODIURIL) 25 MG tablet Take 25 mg by mouth daily.    . naproxen sodium (ALEVE) 220 MG tablet Take 220 mg by mouth as needed.    . doxycycline (DORYX) 100 MG EC tablet Take 100 mg by mouth daily.     Current Facility-Administered Medications  Medication Dose Route Frequency Provider Last Rate Last Dose  . 0.9 %  sodium chloride infusion  500 mL Intravenous Once Armbruster, Carlota Raspberry, MD        Review of Systems  Constitutional:  Constitutional negative. HENT: HENT negative.  Eyes: Eyes negative.  Respiratory: Respiratory negative.  Cardiovascular: Cardiovascular negative.  GI: Gastrointestinal negative.  Skin: Skin negative.  Neurological: Neurological negative. Hematologic: Hematologic/lymphatic negative.  Psychiatric: Psychiatric negative.        Objective:  Objective   Vitals:   05/23/18 0857  BP: 123/82  Pulse: 76  Resp: 20  SpO2: 95%  Weight: 261 lb 12.8 oz (118.8 kg)  Height: 5\' 11"  (1.803 m)   Body mass index is 36.51 kg/m.  Physical Exam  Constitutional: He is oriented to person, place, and time. He appears well-developed.  HENT:  Head: Normocephalic.  Eyes: Pupils are equal, round, and reactive to light.  Neck: Normal range  of motion. Neck supple.  Cardiovascular: Normal rate.  Pulses:      Carotid pulses are 2+ on the right side, and 2+ on the left side.      Radial pulses are 2+ on the right side, and 2+ on the left side.       Femoral pulses are 2+ on the right side, and 2+ on the left side.      Popliteal pulses are 2+ on the right side, and 2+ on the left side.       Dorsalis pedis pulses are 2+ on the right side, and 2+ on the left side.  Pulmonary/Chest: Effort normal.  Abdominal: Soft. He exhibits no distension and no mass. There is no tenderness.  Musculoskeletal: Normal range of motion. He exhibits no edema.  Neurological: He is alert and oriented to person, place, and time.  Skin: Skin is  warm and dry.  Psychiatric: He has a normal mood and affect. His behavior is normal. Judgment and thought content normal.    Data: I reviewed his CT scan with him which demonstrates a 1.5 cm celiac artery aneurysm.     Assessment/Plan:     59 old male with abdominal wall hernia now scheduled for surgical repair.  He has a celiac artery aneurysm that is less than 2 cm the etiology and timing of which are unknown.  From a vascular standpoint he has no limitations to surgery or activity other than heavy lifting with straining which I counseled him against.  I will get him to follow-up in 6 months with a mesenteric duplex to get a second time point of the size he can see me if he has any other issues in the meantime.  We discussed the signs and symptoms of rupture ecchymosis which he demonstrates good understanding.  Short of this we will see him in 6 months.    Waynetta Sandy MD [facility]

## 2018-05-31 DIAGNOSIS — M25552 Pain in left hip: Secondary | ICD-10-CM

## 2018-05-31 HISTORY — DX: Pain in left hip: M25.552

## 2018-06-09 DIAGNOSIS — Z23 Encounter for immunization: Secondary | ICD-10-CM | POA: Diagnosis not present

## 2018-06-13 ENCOUNTER — Other Ambulatory Visit: Payer: Self-pay

## 2018-06-13 DIAGNOSIS — I728 Aneurysm of other specified arteries: Secondary | ICD-10-CM

## 2018-06-20 ENCOUNTER — Encounter (HOSPITAL_COMMUNITY): Payer: Self-pay

## 2018-06-20 DIAGNOSIS — Z0000 Encounter for general adult medical examination without abnormal findings: Secondary | ICD-10-CM | POA: Diagnosis not present

## 2018-06-20 DIAGNOSIS — E785 Hyperlipidemia, unspecified: Secondary | ICD-10-CM | POA: Diagnosis not present

## 2018-06-20 DIAGNOSIS — K219 Gastro-esophageal reflux disease without esophagitis: Secondary | ICD-10-CM | POA: Diagnosis not present

## 2018-06-20 DIAGNOSIS — E669 Obesity, unspecified: Secondary | ICD-10-CM | POA: Diagnosis not present

## 2018-06-20 DIAGNOSIS — I10 Essential (primary) hypertension: Secondary | ICD-10-CM | POA: Diagnosis not present

## 2018-06-20 NOTE — Patient Instructions (Addendum)
Your procedure is scheduled on: Friday, June 27, 2018   Surgery Time:  7:30AM-9:30AM   Report to [facility] Main  Entrance    Report to admitting at 5:30 AM   Call this number if you have problems the morning of surgery [phone]   Do NOT smoke after Midnight   Do not eat food:After Midnight.   Only clear liquids after midnight.       CLEAR LIQUID DIET   Foods Allowed                                                                     Foods Excluded  Coffee and tea, regular and decaf                             liquids that you cannot  Plain Jell-O in any flavor                                             see through such as: Fruit ices (not with fruit pulp)                                     milk, soups, orange juice  Iced Popsicles                                    All solid food Carbonated beverages, regular and diet                                    Cranberry, grape and apple juices Sports drinks like Gatorade Lightly seasoned clear broth or consume(fat free) Sugar, honey syrup  Sample Menu Breakfast                                Lunch                                     Supper Cranberry juice                    Beef broth                            Chicken broth Jell-O                                     Grape juice                           Apple juice Coffee or tea  Jell-O                                      Popsicle                                                Coffee or tea                        Coffee or tea   Nothing in your mouth after 4:30AM (NO GUM, CANDY, OR MINTS).    Please finish carbohydrate drink at 4:30AM morning of surgery   Take these medicines the morning of surgery with A SIP OF WATER: Nexium, Ezetimibe, Loratadine if needed   Bring Asthma Inhaler day of surgery                               You may not have any metal on your body including  jewelry, and body piercings             Do not wear  lotions, powders, perfumes/cologne, or deodorant                          Men may shave face and neck.   Do not bring valuables to the hospital. [place].   Contacts, dentures or bridgework may not be worn into surgery.    Patients discharged the day of surgery will not be allowed to drive home.   Special Instructions: Bring a copy of your healthcare power of attorney and living will documents         the day of surgery if you haven't scanned them in before.              Please read over the following fact sheets you were given:  [facility] - Preparing for Surgery Before surgery, you can play an important role.  Because skin is not sterile, your skin needs to be as free of germs as possible.  You can reduce the number of germs on your skin by washing with CHG (chlorahexidine gluconate) soap before surgery.  CHG is an antiseptic cleaner which kills germs and bonds with the skin to continue killing germs even after washing. Please DO NOT use if you have an allergy to CHG or antibacterial soaps.  If your skin becomes reddened/irritated stop using the CHG and inform your nurse when you arrive at Short Stay. Do not shave (including legs and underarms) for at least 48 hours prior to the first CHG shower.  You may shave your face/neck.  Please follow these instructions carefully:  1.  Shower with CHG Soap the night before surgery and the  morning of surgery.  2.  If you choose to wash your hair, wash your hair first as usual with your normal  shampoo.  3.  After you shampoo, rinse your hair and body thoroughly to remove the shampoo.  4.  Use CHG as you would any other liquid soap.  You can apply chg directly to the skin and wash.  Gently with a scrungie or clean washcloth.  5.  Apply the CHG Soap to your body ONLY FROM THE NECK DOWN.   Do   not use on face/ open                           Wound or open sores. Avoid  contact with eyes, ears mouth and   genitals (private parts).                       Wash face,  Genitals (private parts) with your normal soap.             6.  Wash thoroughly, paying special attention to the area where your    surgery  will be performed.  7.  Thoroughly rinse your body with warm water from the neck down.  8.  DO NOT shower/wash with your normal soap after using and rinsing off the CHG Soap.                9.  Pat yourself dry with a clean towel.            10.  Wear clean pajamas.            11.  Place clean sheets on your bed the night of your first shower and do not  sleep with pets. Day of Surgery : Do not apply any lotions/deodorants the morning of surgery.  Please wear clean clothes to the hospital/surgery center.  FAILURE TO FOLLOW THESE INSTRUCTIONS MAY RESULT IN THE CANCELLATION OF YOUR SURGERY  PATIENT SIGNATURE_________________________________  NURSE SIGNATURE__________________________________  ________________________________________________________________________  WHAT IS A BLOOD TRANSFUSION? Blood Transfusion Information  A transfusion is the replacement of blood or some of its parts. Blood is made up of multiple cells which provide different functions.  Red blood cells carry oxygen and are used for blood loss replacement.  White blood cells fight against infection.  Platelets control bleeding.  Plasma helps clot blood.  Other blood products are available for specialized needs, such as hemophilia or other clotting disorders. BEFORE THE TRANSFUSION  Who gives blood for transfusions?   Healthy volunteers who are fully evaluated to make sure their blood is safe. This is blood bank blood. Transfusion therapy is the safest it has ever been in the practice of medicine. Before blood is taken from a donor, a complete history is taken to make sure that person has no history of diseases nor engages in risky social behavior (examples are intravenous drug use  or sexual activity with multiple partners). The donor's travel history is screened to minimize risk of transmitting infections, such as malaria. The donated blood is tested for signs of infectious diseases, such as HIV and hepatitis. The blood is then tested to be sure it is compatible with you in order to minimize the chance of a transfusion reaction. If you or a relative donates blood, this is often done in anticipation of surgery and is not appropriate for emergency situations. It takes many days to process the donated blood. RISKS AND COMPLICATIONS Although transfusion therapy is very safe and saves many lives, the main dangers of transfusion include:   Getting an infectious disease.  Developing a transfusion reaction. This is an allergic reaction to something in the blood you were given. Every precaution  is taken to prevent this. The decision to have a blood transfusion has been considered carefully by your caregiver before blood is given. Blood is not given unless the benefits outweigh the risks. AFTER THE TRANSFUSION  Right after receiving a blood transfusion, you will usually feel much better and more energetic. This is especially true if your red blood cells have gotten low (anemic). The transfusion raises the level of the red blood cells which carry oxygen, and this usually causes an energy increase.  The nurse administering the transfusion will monitor you carefully for complications. HOME CARE INSTRUCTIONS  No special instructions are needed after a transfusion. You may find your energy is better. Speak with your caregiver about any limitations on activity for underlying diseases you may have. SEEK MEDICAL CARE IF:   Your condition is not improving after your transfusion.  You develop redness or irritation at the intravenous (IV) site. SEEK IMMEDIATE MEDICAL CARE IF:  Any of the following symptoms occur over the next 12 hours:  Shaking chills.  You have a temperature by mouth  above 102 F (38.9 C), not controlled by medicine.  Chest, back, or muscle pain.  People around you feel you are not acting correctly or are confused.  Shortness of breath or difficulty breathing.  Dizziness and fainting.  You get a rash or develop hives.  You have a decrease in urine output.  Your urine turns a dark color or changes to pink, red, or brown. Any of the following symptoms occur over the next 10 days:  You have a temperature by mouth above 102 F (38.9 C), not controlled by medicine.  Shortness of breath.  Weakness after normal activity.  The white part of the eye turns yellow (jaundice).  You have a decrease in the amount of urine or are urinating less often.  Your urine turns a dark color or changes to pink, red, or brown. Document Released: 12/14/2000 Document Revised: 03/10/2012 Document Reviewed: 08/02/2008 [facility] Patient Information 2014 [place], [place].  _______________________________________________________________________

## 2018-06-24 ENCOUNTER — Other Ambulatory Visit: Payer: Self-pay

## 2018-06-24 ENCOUNTER — Encounter (HOSPITAL_COMMUNITY)
Admission: RE | Admit: 2018-06-24 | Discharge: 2018-06-24 | Disposition: A | Discharge status: Home / Self Care | Payer: Medicare Other | Source: Ambulatory Visit | Attending: General Surgery | Admitting: General Surgery

## 2018-06-24 ENCOUNTER — Encounter (HOSPITAL_COMMUNITY): Payer: Self-pay

## 2018-06-24 DIAGNOSIS — Z87891 Personal history of nicotine dependence: Secondary | ICD-10-CM | POA: Diagnosis not present

## 2018-06-24 DIAGNOSIS — K219 Gastro-esophageal reflux disease without esophagitis: Secondary | ICD-10-CM | POA: Diagnosis not present

## 2018-06-24 DIAGNOSIS — J45909 Unspecified asthma, uncomplicated: Secondary | ICD-10-CM | POA: Diagnosis not present

## 2018-06-24 DIAGNOSIS — I10 Essential (primary) hypertension: Secondary | ICD-10-CM | POA: Diagnosis not present

## 2018-06-24 DIAGNOSIS — Z01812 Encounter for preprocedural laboratory examination: Secondary | ICD-10-CM | POA: Insufficient documentation

## 2018-06-24 DIAGNOSIS — E7800 Pure hypercholesterolemia, unspecified: Secondary | ICD-10-CM | POA: Diagnosis not present

## 2018-06-24 DIAGNOSIS — E785 Hyperlipidemia, unspecified: Secondary | ICD-10-CM | POA: Diagnosis not present

## 2018-06-24 DIAGNOSIS — Z01810 Encounter for preprocedural cardiovascular examination: Secondary | ICD-10-CM

## 2018-06-24 DIAGNOSIS — Z833 Family history of diabetes mellitus: Secondary | ICD-10-CM | POA: Diagnosis not present

## 2018-06-24 DIAGNOSIS — K436 Other and unspecified ventral hernia with obstruction, without gangrene: Secondary | ICD-10-CM | POA: Diagnosis not present

## 2018-06-24 DIAGNOSIS — Z87442 Personal history of urinary calculi: Secondary | ICD-10-CM | POA: Diagnosis not present

## 2018-06-24 DIAGNOSIS — Z8249 Family history of ischemic heart disease and other diseases of the circulatory system: Secondary | ICD-10-CM | POA: Diagnosis not present

## 2018-06-24 DIAGNOSIS — K439 Ventral hernia without obstruction or gangrene: Secondary | ICD-10-CM | POA: Diagnosis present

## 2018-06-24 DIAGNOSIS — Z79899 Other long term (current) drug therapy: Secondary | ICD-10-CM | POA: Diagnosis not present

## 2018-06-24 DIAGNOSIS — Z880 Allergy status to penicillin: Secondary | ICD-10-CM | POA: Diagnosis not present

## 2018-06-24 HISTORY — DX: Inflammatory liver disease, unspecified: K75.9

## 2018-06-24 HISTORY — DX: Reserved for concepts with insufficient information to code with codable children: IMO0002

## 2018-06-24 HISTORY — DX: Atherosclerosis of aorta: I70.0

## 2018-06-24 HISTORY — DX: Gastro-esophageal reflux disease without esophagitis: K21.9

## 2018-06-24 HISTORY — DX: Aneurysm of other specified arteries: I72.8

## 2018-06-24 HISTORY — DX: Unspecified abdominal hernia without obstruction or gangrene: K46.9

## 2018-06-24 HISTORY — DX: Pain in left hip: M25.552

## 2018-06-24 HISTORY — DX: Personal history of other diseases of the digestive system: Z87.19

## 2018-06-24 HISTORY — DX: Unspecified asthma, uncomplicated: J45.909

## 2018-06-24 HISTORY — DX: Personal history of urinary calculi: Z87.442

## 2018-06-24 LAB — BASIC METABOLIC PANEL
Anion gap: 9 (ref 5–15)
BUN: 18 mg/dL (ref 8–23)
CO2: 27 mmol/L (ref 22–32)
CREATININE: 1.01 mg/dL (ref 0.61–1.24)
Calcium: 9.5 mg/dL (ref 8.9–10.3)
Chloride: 104 mmol/L (ref 98–111)
GFR calc Af Amer: >60 mL/min (ref >60)
GFR calc non Af Amer: >60 mL/min (ref >60)
Glucose, Bld: 113 mg/dL — ABNORMAL HIGH (ref 70–99)
Potassium: 4.1 mmol/L (ref 3.5–5.1)
Sodium: 140 mmol/L (ref 135–145)

## 2018-06-24 LAB — CBC
HCT: 44.5 % (ref 39.0–52.0)
Hemoglobin: 16 g/dL (ref 13.0–17.0)
MCH: 33.5 pg (ref 26.0–34.0)
MCHC: 36 g/dL (ref 30.0–36.0)
MCV: 93.3 fL (ref 78.0–100.0)
PLATELETS: 247 10*3/uL (ref 150–400)
RBC: 4.77 MIL/uL (ref 4.22–5.81)
RDW: 12.8 % (ref 11.5–15.5)
WBC: 6.2 10*3/uL (ref 4.0–10.5)

## 2018-06-24 LAB — ABO/RH: ABO/RH(D): B POS

## 2018-06-24 NOTE — Pre-Procedure Instructions (Signed)
BMP results 06/24/2018 faxed to Dr. Rosendo Gros via epic.

## 2018-06-26 ENCOUNTER — Encounter (HOSPITAL_COMMUNITY): Payer: Self-pay | Admitting: Certified Registered Nurse Anesthetist

## 2018-06-26 DIAGNOSIS — I129 Hypertensive chronic kidney disease with stage 1 through stage 4 chronic kidney disease, or unspecified chronic kidney disease: Secondary | ICD-10-CM | POA: Diagnosis not present

## 2018-06-26 DIAGNOSIS — Z0000 Encounter for general adult medical examination without abnormal findings: Secondary | ICD-10-CM | POA: Diagnosis not present

## 2018-06-26 DIAGNOSIS — K219 Gastro-esophageal reflux disease without esophagitis: Secondary | ICD-10-CM | POA: Diagnosis not present

## 2018-06-26 DIAGNOSIS — F17211 Nicotine dependence, cigarettes, in remission: Secondary | ICD-10-CM | POA: Diagnosis not present

## 2018-06-26 DIAGNOSIS — E785 Hyperlipidemia, unspecified: Secondary | ICD-10-CM | POA: Diagnosis not present

## 2018-06-26 DIAGNOSIS — M5442 Lumbago with sciatica, left side: Secondary | ICD-10-CM | POA: Diagnosis not present

## 2018-06-26 DIAGNOSIS — E669 Obesity, unspecified: Secondary | ICD-10-CM | POA: Diagnosis not present

## 2018-06-26 DIAGNOSIS — N182 Chronic kidney disease, stage 2 (mild): Secondary | ICD-10-CM | POA: Diagnosis not present

## 2018-06-26 DIAGNOSIS — J449 Chronic obstructive pulmonary disease, unspecified: Secondary | ICD-10-CM | POA: Diagnosis not present

## 2018-06-26 DIAGNOSIS — M79675 Pain in left toe(s): Secondary | ICD-10-CM | POA: Diagnosis not present

## 2018-06-26 DIAGNOSIS — I700 Atherosclerosis of aorta: Secondary | ICD-10-CM | POA: Diagnosis not present

## 2018-06-27 ENCOUNTER — Encounter (HOSPITAL_COMMUNITY): Payer: Self-pay

## 2018-06-27 ENCOUNTER — Encounter (HOSPITAL_COMMUNITY)
Admission: RE | Disposition: A | Discharge status: Home / Self Care | Payer: Self-pay | Source: Ambulatory Visit | Attending: General Surgery

## 2018-06-27 ENCOUNTER — Ambulatory Visit (HOSPITAL_COMMUNITY): Payer: Medicare Other | Admitting: Certified Registered Nurse Anesthetist

## 2018-06-27 ENCOUNTER — Ambulatory Visit (HOSPITAL_COMMUNITY)
Admission: RE | Admit: 2018-06-27 | Discharge: 2018-06-27 | Disposition: A | Discharge status: Home / Self Care | Payer: Medicare Other | Source: Ambulatory Visit | Attending: General Surgery | Admitting: General Surgery

## 2018-06-27 DIAGNOSIS — K439 Ventral hernia without obstruction or gangrene: Secondary | ICD-10-CM | POA: Diagnosis not present

## 2018-06-27 DIAGNOSIS — I10 Essential (primary) hypertension: Secondary | ICD-10-CM | POA: Diagnosis not present

## 2018-06-27 DIAGNOSIS — E7800 Pure hypercholesterolemia, unspecified: Secondary | ICD-10-CM | POA: Insufficient documentation

## 2018-06-27 DIAGNOSIS — Z87891 Personal history of nicotine dependence: Secondary | ICD-10-CM | POA: Diagnosis not present

## 2018-06-27 DIAGNOSIS — Z79899 Other long term (current) drug therapy: Secondary | ICD-10-CM | POA: Diagnosis not present

## 2018-06-27 DIAGNOSIS — E785 Hyperlipidemia, unspecified: Secondary | ICD-10-CM | POA: Diagnosis not present

## 2018-06-27 DIAGNOSIS — Z880 Allergy status to penicillin: Secondary | ICD-10-CM | POA: Diagnosis not present

## 2018-06-27 DIAGNOSIS — K219 Gastro-esophageal reflux disease without esophagitis: Secondary | ICD-10-CM | POA: Insufficient documentation

## 2018-06-27 DIAGNOSIS — K436 Other and unspecified ventral hernia with obstruction, without gangrene: Secondary | ICD-10-CM | POA: Diagnosis not present

## 2018-06-27 DIAGNOSIS — Z8249 Family history of ischemic heart disease and other diseases of the circulatory system: Secondary | ICD-10-CM | POA: Diagnosis not present

## 2018-06-27 DIAGNOSIS — Z833 Family history of diabetes mellitus: Secondary | ICD-10-CM | POA: Diagnosis not present

## 2018-06-27 DIAGNOSIS — Z87442 Personal history of urinary calculi: Secondary | ICD-10-CM | POA: Diagnosis not present

## 2018-06-27 DIAGNOSIS — G8918 Other acute postprocedural pain: Secondary | ICD-10-CM | POA: Diagnosis not present

## 2018-06-27 DIAGNOSIS — J45909 Unspecified asthma, uncomplicated: Secondary | ICD-10-CM | POA: Insufficient documentation

## 2018-06-27 HISTORY — PX: XI ROBOTIC ASSISTED VENTRAL HERNIA: SHX6789

## 2018-06-27 HISTORY — PX: INSERTION OF MESH: SHX5868

## 2018-06-27 LAB — TYPE AND SCREEN
ABO/RH(D): B POS
Antibody Screen: NEGATIVE

## 2018-06-27 SURGERY — REPAIR, HERNIA, VENTRAL, ROBOT-ASSISTED
Anesthesia: General | Site: Abdomen

## 2018-06-27 MED ORDER — DEXAMETHASONE SODIUM PHOSPHATE 10 MG/ML IJ SOLN
INTRAMUSCULAR | Status: AC
  Filled 2018-06-27: qty 1

## 2018-06-27 MED ORDER — FENTANYL CITRATE (PF) 100 MCG/2ML IJ SOLN
INTRAMUSCULAR | Status: AC
  Filled 2018-06-27: qty 2

## 2018-06-27 MED ORDER — MEPERIDINE HCL 50 MG/ML IJ SOLN: 6.2500 mg | INTRAMUSCULAR | Status: DC | PRN

## 2018-06-27 MED ORDER — SUGAMMADEX SODIUM 200 MG/2ML IV SOLN
INTRAVENOUS | Status: DC | PRN
  Administered 2018-06-27: 200 mg via INTRAVENOUS

## 2018-06-27 MED ORDER — OXYCODONE HCL 5 MG PO TABS: 5.0000 mg | ORAL_TABLET | Freq: Once | ORAL | Status: DC | PRN

## 2018-06-27 MED ORDER — TRAMADOL HCL 50 MG PO TABS: 50.0000 mg | ORAL_TABLET | Freq: Four times a day (QID) | ORAL | 0 refills | Status: AC | PRN

## 2018-06-27 MED ORDER — PROPOFOL 10 MG/ML IV BOLUS
INTRAVENOUS | Status: DC | PRN
  Administered 2018-06-27: 150 mg via INTRAVENOUS

## 2018-06-27 MED ORDER — FENTANYL CITRATE (PF) 100 MCG/2ML IJ SOLN
INTRAMUSCULAR | Status: DC | PRN
  Administered 2018-06-27: 50 ug via INTRAVENOUS
  Administered 2018-06-27: 100 ug via INTRAVENOUS
  Administered 2018-06-27 (×4): 50 ug via INTRAVENOUS

## 2018-06-27 MED ORDER — MIDAZOLAM HCL 5 MG/5ML IJ SOLN
INTRAMUSCULAR | Status: DC | PRN
  Administered 2018-06-27: 2 mg via INTRAVENOUS

## 2018-06-27 MED ORDER — ROPIVACAINE HCL 5 MG/ML IJ SOLN
INTRAMUSCULAR | Status: DC | PRN
  Administered 2018-06-27: 30 mL via PERINEURAL

## 2018-06-27 MED ORDER — OXYCODONE HCL 5 MG/5ML PO SOLN
5.0000 mg | Freq: Once | ORAL | Status: DC | PRN
  Filled 2018-06-27: qty 5

## 2018-06-27 MED ORDER — BUPIVACAINE-EPINEPHRINE 0.25% -1:200000 IJ SOLN
INTRAMUSCULAR | Status: DC | PRN
  Administered 2018-06-27: 16 mL

## 2018-06-27 MED ORDER — BUPIVACAINE-EPINEPHRINE (PF) 0.25% -1:200000 IJ SOLN
INTRAMUSCULAR | Status: AC
  Filled 2018-06-27: qty 30

## 2018-06-27 MED ORDER — ONDANSETRON HCL 4 MG/2ML IJ SOLN
INTRAMUSCULAR | Status: AC
  Filled 2018-06-27: qty 2

## 2018-06-27 MED ORDER — PROPOFOL 10 MG/ML IV BOLUS
INTRAVENOUS | Status: AC
  Filled 2018-06-27: qty 20

## 2018-06-27 MED ORDER — DEXAMETHASONE SODIUM PHOSPHATE 4 MG/ML IJ SOLN
INTRAMUSCULAR | Status: DC | PRN
  Administered 2018-06-27: 10 mg via INTRAVENOUS

## 2018-06-27 MED ORDER — ROCURONIUM BROMIDE 100 MG/10ML IV SOLN
INTRAVENOUS | Status: AC
  Filled 2018-06-27: qty 1

## 2018-06-27 MED ORDER — CEFAZOLIN SODIUM-DEXTROSE 2-4 GM/100ML-% IV SOLN
2.0000 g | INTRAVENOUS | Status: AC
  Administered 2018-06-27: 2 g via INTRAVENOUS
  Filled 2018-06-27: qty 100

## 2018-06-27 MED ORDER — ONDANSETRON HCL 4 MG/2ML IJ SOLN
INTRAMUSCULAR | Status: DC | PRN
  Administered 2018-06-27: 4 mg via INTRAVENOUS

## 2018-06-27 MED ORDER — 0.9 % SODIUM CHLORIDE (POUR BTL) OPTIME
TOPICAL | Status: DC | PRN
  Administered 2018-06-27: 1000 mL

## 2018-06-27 MED ORDER — ACETAMINOPHEN 500 MG PO TABS
1000.0000 mg | ORAL_TABLET | ORAL | Status: AC
  Administered 2018-06-27: 1000 mg via ORAL
  Filled 2018-06-27: qty 2

## 2018-06-27 MED ORDER — CHLORHEXIDINE GLUCONATE CLOTH 2 % EX PADS: 6.0000 | MEDICATED_PAD | Freq: Once | CUTANEOUS | Status: DC

## 2018-06-27 MED ORDER — LACTATED RINGERS IR SOLN
Status: DC | PRN
  Administered 2018-06-27: 1

## 2018-06-27 MED ORDER — LACTATED RINGERS IV SOLN
INTRAVENOUS | Status: DC | PRN
  Administered 2018-06-27 (×2): via INTRAVENOUS

## 2018-06-27 MED ORDER — LIDOCAINE HCL (PF) 2 % IJ SOLN
INTRAMUSCULAR | Status: DC | PRN
  Administered 2018-06-27: 80 mg via INTRADERMAL

## 2018-06-27 MED ORDER — PROMETHAZINE HCL 25 MG/ML IJ SOLN: 6.2500 mg | INTRAMUSCULAR | Status: DC | PRN

## 2018-06-27 MED ORDER — SUGAMMADEX SODIUM 200 MG/2ML IV SOLN
INTRAVENOUS | Status: AC
  Filled 2018-06-27: qty 2

## 2018-06-27 MED ORDER — FENTANYL CITRATE (PF) 250 MCG/5ML IJ SOLN
INTRAMUSCULAR | Status: AC
  Filled 2018-06-27: qty 5

## 2018-06-27 MED ORDER — MIDAZOLAM HCL 2 MG/2ML IJ SOLN
INTRAMUSCULAR | Status: AC
  Filled 2018-06-27: qty 2

## 2018-06-27 MED ORDER — ROCURONIUM BROMIDE 10 MG/ML (PF) SYRINGE
PREFILLED_SYRINGE | INTRAVENOUS | Status: DC | PRN
  Administered 2018-06-27: 20 mg via INTRAVENOUS
  Administered 2018-06-27: 50 mg via INTRAVENOUS

## 2018-06-27 MED ORDER — HYDROMORPHONE HCL 1 MG/ML IJ SOLN: 0.2500 mg | INTRAMUSCULAR | Status: DC | PRN

## 2018-06-27 MED ORDER — GABAPENTIN 300 MG PO CAPS
300.0000 mg | ORAL_CAPSULE | ORAL | Status: AC
  Administered 2018-06-27: 300 mg via ORAL
  Filled 2018-06-27: qty 1

## 2018-06-27 MED ORDER — LIDOCAINE 2% (20 MG/ML) 5 ML SYRINGE
INTRAMUSCULAR | Status: AC
  Filled 2018-06-27: qty 5

## 2018-06-27 MED ORDER — CELECOXIB 200 MG PO CAPS
200.0000 mg | ORAL_CAPSULE | ORAL | Status: AC
  Administered 2018-06-27: 200 mg via ORAL
  Filled 2018-06-27: qty 1

## 2018-06-27 MED ORDER — SUCCINYLCHOLINE CHLORIDE 200 MG/10ML IV SOSY
PREFILLED_SYRINGE | INTRAVENOUS | Status: DC | PRN
  Administered 2018-06-27: 100 mg via INTRAVENOUS

## 2018-06-27 SURGICAL SUPPLY — 40 items
BLADE SURG SZ11 CARB STEEL (BLADE) ×3 IMPLANT
CHLORAPREP W/TINT 26ML (MISCELLANEOUS) ×3 IMPLANT
COVER SURGICAL LIGHT HANDLE (MISCELLANEOUS) ×3 IMPLANT
COVER TIP SHEARS 8 DVNC (MISCELLANEOUS) ×1 IMPLANT
COVER TIP SHEARS 8MM DA VINCI (MISCELLANEOUS) ×2
DECANTER SPIKE VIAL GLASS SM (MISCELLANEOUS) ×3 IMPLANT
DERMABOND ADVANCED (GAUZE/BANDAGES/DRESSINGS) ×2
DERMABOND ADVANCED .7 DNX12 (GAUZE/BANDAGES/DRESSINGS) ×1 IMPLANT
DEVICE SECURE STRAP 25 ABSORB (INSTRUMENTS) IMPLANT
DRAPE ARM DVNC X/XI (DISPOSABLE) ×4 IMPLANT
DRAPE COLUMN DVNC XI (DISPOSABLE) ×1 IMPLANT
DRAPE DA VINCI XI ARM (DISPOSABLE) ×8
DRAPE DA VINCI XI COLUMN (DISPOSABLE) ×2
ELECT REM PT RETURN 15FT ADLT (MISCELLANEOUS) ×3 IMPLANT
GAUZE 4X4 16PLY RFD (DISPOSABLE) ×3 IMPLANT
GLOVE BIO SURGEON STRL SZ7.5 (GLOVE) ×6 IMPLANT
GOWN STRL REUS W/TWL XL LVL3 (GOWN DISPOSABLE) ×9 IMPLANT
KIT BASIN OR (CUSTOM PROCEDURE TRAY) ×3 IMPLANT
MESH VENTRALIGHT ST 4.5IN (Mesh General) ×3 IMPLANT
NEEDLE HYPO 22GX1.5 SAFETY (NEEDLE) ×3 IMPLANT
NEEDLE INSUFFLATION 14GA 120MM (NEEDLE) ×3 IMPLANT
PACK CARDIOVASCULAR III (CUSTOM PROCEDURE TRAY) ×3 IMPLANT
SCISSORS LAP 5X35 DISP (ENDOMECHANICALS) ×3 IMPLANT
SEAL CANN UNIV 5-8 DVNC XI (MISCELLANEOUS) ×4 IMPLANT
SEAL XI 5MM-8MM UNIVERSAL (MISCELLANEOUS) ×8
SET IRRIG TUBING LAPAROSCOPIC (IRRIGATION / IRRIGATOR) ×3 IMPLANT
SOLUTION ANTI FOG 6CC (MISCELLANEOUS) ×3 IMPLANT
SOLUTION ELECTROLUBE (MISCELLANEOUS) ×3 IMPLANT
SUT MNCRL AB 4-0 PS2 18 (SUTURE) ×3 IMPLANT
SUT V-LOC BARB 180 2/0GR6 GS22 (SUTURE) ×3
SUT VIC AB 3-0 SH 27 (SUTURE)
SUT VIC AB 3-0 SH 27XBRD (SUTURE) IMPLANT
SUT VLOC 180 0 9IN  GS21 (SUTURE)
SUT VLOC 180 0 9IN GS21 (SUTURE) IMPLANT
SUT VLOC 180 2-0 9IN GS21 (SUTURE) ×6 IMPLANT
SUTURE V-LC BRB 180 2/0GR6GS22 (SUTURE) ×1 IMPLANT
SYR 20CC LL (SYRINGE) ×3 IMPLANT
TOWEL OR 17X26 10 PK STRL BLUE (TOWEL DISPOSABLE) ×3 IMPLANT
TOWEL OR NON WOVEN STRL DISP B (DISPOSABLE) ×3 IMPLANT
TUBING INSUFFLATION (TUBING) ×3 IMPLANT

## 2018-06-27 NOTE — Op Note (Signed)
06/27/2018  9:25 AM  PATIENT:  Theodore Brooks  67 y.o. male  PRE-OPERATIVE DIAGNOSIS:  Right Spigelian Hernia  POST-OPERATIVE DIAGNOSIS:  Right Incarcerated Spigelian Hernia  PROCEDURE:  Procedure(s): XI ROBOTIC ASSISTED SPIGELIAN HERNIA, (N/A) INSERTION OF MESH (N/A)  SURGEON:  Surgeon(s) and Role:    * Ralene Ok, MD - Primary    * Clovis Riley, MD - Assisting  ANESTHESIA:   local, regional and general  EBL:  10 mL   BLOOD ADMINISTERED:none  DRAINS: none   LOCAL MEDICATIONS USED:  BUPIVICAINE   SPECIMEN:  No Specimen  DISPOSITION OF SPECIMEN:  N/A  COUNTS:  YES  TOURNIQUET:  * No tourniquets in log *  DICTATION: .Dragon Dictation  Indication for procedure: Patient is a 67 year old male with right lower quadrant abdominal pain.  Patient was CT scan was found to have a right lower quadrant spigelian hernia.  Secondary pain discomfort patient was taken to the operating for hernia repair.  Findings: Patient had approximately 2 cm right lower quadrant spilling hernia defect.  There is large amount of preperitoneal fat.  The cecum appeared to be adherent to the peritoneal side.  A 11.4 cm ventral light ST mesh was then placed in the preperitoneal space.  The overlap was approximately 4-5 cm.   Details of procedure: After consent the patient was taken back to the operating placed in supine position with bilateral SCDs in place.  Underwent general endotracheal intubation.  Patient was then placed in the right lateral decubitus position.  Patient prepped draped sterile fashion.  Timeout was called all facts verified.  At this time a Veress needle technique was used to insufflate the abdomen to 15 mmHg in the right subcostal margin.  Subsequent to this a 8 mm trocar was placed intra-abdominal.  The robotic camera was then placed intra-abdominally.  There is no injury change abdominal organs.  2 more 8 mm trochars were then placed under direct  visualization in the midline area, umbilicus and suprapubic area.  These were approximately 10 cm away from the other ports.  At this time the robot was docked to the trochars.  Instruments were advanced.  At this time the area of the cecum was seen adherent to the peritoneum.  I then began creating a peritoneal flap approximately 4 to 5 cm away from this area of adhesion and the likely area of the spigelian hernia.  The preperitoneal space was entered.  The flimsy adhesions were taken down with both cautery dissection and blunt dissection.  The area of the hernia could easily be seen.  This was circumferentially dissected away from surrounding musculature and peritoneum.  At this time the hernia and preperitoneal fat were reduced.  There is large amount of preperitoneal fat that was incarcerated within the spigelian hernia.  Once this was virtually cleared away a 2V lock stitch was used to reapproximate the internal oblique layer.  This was done in a standard running fashion.  At this time a 11.4 cm ventral light ST mesh was then placed in the preperitoneal space.  This was circumferentially secured to the belly of the internal oblique at the 12:00, 3:00, 6:00, 9:00 positions using a 2-0 Vicryl in interrupted fashion.  The mesh lay flat against the internal oblique fascia.  The mesh overlapped the hernia by approximately 4-5 cm.  At this time the peritoneal incision was then reapproximated using a running 2 oh VueLock suture.  At this time the insufflation was evacuated.  The robot was undocked.  All trochars were removed.  The skin was then reapproximated using 4-0 Monocryl subcuticular fashion.  Skin was dressed with Dermabond.  The patient tolerated the procedure well was taken to the recovery in stable condition.  PLAN OF CARE: Discharge to home after PACU  PATIENT DISPOSITION:  PACU - hemodynamically stable.   Delay start of Pharmacological VTE agent (>24hrs) due to surgical blood loss  or risk of bleeding: not applicable

## 2018-06-27 NOTE — Anesthesia Procedure Notes (Signed)
Anesthesia Regional Block: TAP block   Pre-Anesthetic Checklist: ,, timeout performed, Correct Patient, Correct Site, Correct Laterality, Correct Procedure, Correct Position, site marked, Risks and benefits discussed,  Surgical consent,  Pre-op evaluation,  At surgeon's request and post-op pain management  Laterality: Right  Prep: chloraprep       Needles:  Injection technique: Single-shot  Needle Type: Stimiplex     Needle Length: 9cm  Needle Gauge: 21     Additional Needles:   Procedures:,,,, ultrasound used (permanent image in chart),,,,  Narrative:  Start time: 06/27/2018 7:30 AM End time: 06/27/2018 7:35 AM Injection made incrementally with aspirations every 5 mL.  Performed by: Personally  Anesthesiologist: Lynda Rainwater, MD

## 2018-06-27 NOTE — Brief Op Note (Signed)
06/27/2018  9:25 AM  PATIENT:  Theodore Brooks  67 y.o. male  PRE-OPERATIVE DIAGNOSIS:  Right Spigelian Hernia  POST-OPERATIVE DIAGNOSIS:  Right Incarcerated Spigelian Hernia  PROCEDURE:  Procedure(s): XI ROBOTIC ASSISTED SPIGELIAN HERNIA, (N/A) INSERTION OF MESH (N/A)  SURGEON:  Surgeon(s) and Role:    * Ralene Ok, MD - Primary    * Clovis Riley, MD - Assisting  ANESTHESIA:   local, regional and general  EBL:  10 mL   BLOOD ADMINISTERED:none  DRAINS: none   LOCAL MEDICATIONS USED:  BUPIVICAINE   SPECIMEN:  No Specimen  DISPOSITION OF SPECIMEN:  N/A  COUNTS:  YES  TOURNIQUET:  * No tourniquets in log *  DICTATION: .Dragon Dictation  Indication for procedure: Patient is a 67 year old male with right lower quadrant abdominal pain.  Patient was CT scan was found to have a right lower quadrant spigelian hernia.  Secondary pain discomfort patient was taken to the operating for hernia repair.  Findings: Patient had approximately 2 cm right lower quadrant spilling hernia defect.  There is large amount of preperitoneal fat.  The cecum appeared to be adherent to the peritoneal side.  A 11.4 cm ventral light ST mesh was then placed in the preperitoneal space.  The overlap was approximately 4-5 cm.   Details of procedure: After consent the patient was taken back to the operating placed in supine position with bilateral SCDs in place.  Underwent general endotracheal intubation.  Patient was then placed in the right lateral decubitus position.  Patient prepped draped sterile fashion.  Timeout was called all facts verified.  At this time a Veress needle technique was used to insufflate the abdomen to 15 mmHg in the right subcostal margin.  Subsequent to this a 8 mm trocar was placed intra-abdominal.  The robotic camera was then placed intra-abdominally.  There is no injury change abdominal organs.  2 more 8 mm trochars were then placed under direct visualization in the  midline area, umbilicus and suprapubic area.  These were approximately 10 cm away from the other ports.  At this time the robot was docked to the trochars.  Instruments were advanced.  At this time the area of the cecum was seen adherent to the peritoneum.  I then began creating a peritoneal flap approximately 4 to 5 cm away from this area of adhesion and the likely area of the spigelian hernia.  The preperitoneal space was entered.  The flimsy adhesions were taken down with both cautery dissection and blunt dissection.  The area of the hernia could easily be seen.  This was circumferentially dissected away from surrounding musculature and peritoneum.  At this time the hernia and preperitoneal fat were reduced.  There is large amount of preperitoneal fat that was incarcerated within the spigelian hernia.  Once this was virtually cleared away a 2V lock stitch was used to reapproximate the internal oblique layer.  This was done in a standard running fashion.  At this time a 11.4 cm ventral light ST mesh was then placed in the preperitoneal space.  This was circumferentially secured to the belly of the internal oblique at the 12:00, 3:00, 6:00, 9:00 positions using a 2-0 Vicryl in interrupted fashion.  The mesh lay flat against the internal oblique fascia.  The mesh overlapped the hernia by approximately 4-5 cm.  At this time the peritoneal incision was then reapproximated using a running 2 oh VueLock suture.  At this time the insufflation was evacuated.  The robot was undocked.  All trochars were removed.  The skin was then reapproximated using 4-0 Monocryl subcuticular fashion.  Skin was dressed with Dermabond.  The patient tolerated the procedure well was taken to the recovery in stable condition.  PLAN OF CARE: Discharge to home after PACU  PATIENT DISPOSITION:  PACU - hemodynamically stable.   Delay start of Pharmacological VTE agent (>24hrs) due to surgical blood loss or risk of bleeding: not  applicable

## 2018-06-27 NOTE — Anesthesia Postprocedure Evaluation (Signed)
Anesthesia Post Note  Patient: Theodore Brooks  Procedure(s) Performed: XI ROBOTIC ASSISTED SPIGELIAN HERNIA, (N/A Abdomen) INSERTION OF MESH (N/A Abdomen)     Patient location during evaluation: PACU Anesthesia Type: General Level of consciousness: awake and alert Pain management: pain level controlled Vital Signs Assessment: post-procedure vital signs reviewed and stable Respiratory status: spontaneous breathing, nonlabored ventilation and respiratory function stable Cardiovascular status: blood pressure returned to baseline and stable Postop Assessment: no apparent nausea or vomiting Anesthetic complications: no    Last Vitals:  Vitals:   06/27/18 1100 06/27/18 1125  BP: 134/87 138/88  Pulse: 62 64  Resp: 16 14  Temp:    SpO2: 95% 95%    Last Pain:  Vitals:   06/27/18 1125  TempSrc:   PainSc: 2                  Lynda Rainwater

## 2018-06-27 NOTE — Discharge Instructions (Signed)
[facility] _______[facility] ° °HERNIA REPAIR: POST OP INSTRUCTIONS ° °Always review your discharge instruction sheet given to you by the facility where your surgery was performed. °IF YOU HAVE DISABILITY OR FAMILY LEAVE FORMS, YOU MUST BRING THEM TO THE OFFICE FOR PROCESSING.   °DO NOT GIVE THEM TO YOUR DOCTOR. ° °1. A  prescription for pain medication may be given to you upon discharge.  Take your pain medication as prescribed, if needed.  If narcotic pain medicine is not needed, then you may take acetaminophen (Tylenol) or ibuprofen (Advil) as needed. °2. Take your usually prescribed medications unless otherwise directed. °If you need a refill on your pain medication, please contact your pharmacy.  They will contact our office to request authorization. Prescriptions will not be filled after 5 pm or on week-ends. °3. You should follow a light diet the first 24 hours after arrival home, such as soup and crackers, etc.  Be sure to include lots of fluids daily.  Resume your normal diet the day after surgery. °4.Most patients will experience some swelling and bruising around the umbilicus or in the groin and scrotum.  Ice packs and reclining will help.  Swelling and bruising can take several days to resolve.  °6. It is common to experience some constipation if taking pain medication after surgery.  Increasing fluid intake and taking a stool softener (such as Colace) will usually help or prevent this problem from occurring.  A mild laxative (Milk of Magnesia or Miralax) should be taken according to package directions if there are no bowel movements after 48 hours. °7. Unless discharge instructions indicate otherwise, you may remove your bandages 24-48 hours after surgery, and you may shower at that time.  You may have steri-strips (small skin tapes) in place directly over the incision.  These strips should be left on the skin for 7-10 days.  If your surgeon used skin glue on the incision, you may shower in  24 hours.  The glue will flake off over the next 2-3 weeks.  Any sutures or staples will be removed at the office during your follow-up visit. °8. ACTIVITIES:  You may resume regular (light) daily activities beginning the next day--such as daily self-care, walking, climbing stairs--gradually increasing activities as tolerated.  You may have sexual intercourse when it is comfortable.  Refrain from any heavy lifting or straining until approved by your doctor. ° °a.You may drive when you are no longer taking prescription pain medication, you can comfortably wear a seatbelt, and you can safely maneuver your car and apply brakes. °b.RETURN TO WORK:   °_____________________________________________ ° °9.You should see your doctor in the office for a follow-up appointment approximately 2-3 weeks after your surgery.  Make sure that you call for this appointment within a day or two after you arrive home to insure a convenient appointment time. °10.OTHER INSTRUCTIONS: _________________________ °   _____________________________________ ° °WHEN TO CALL YOUR DOCTOR: °1. Fever over 101.0 °2. Inability to urinate °3. Nausea and/or vomiting °4. Extreme swelling or bruising °5. Continued bleeding from incision. °6. Increased pain, redness, or drainage from the incision ° °The clinic staff is available to answer your questions during regular business hours.  Please don’t hesitate to call and ask to speak to one of the nurses for clinical concerns.  If you have a medical emergency, go to the nearest emergency room or call 911.  A surgeon from [facility] is always on call at the hospital ° ° °[address], [address], [place], [place]  [address] ? ° [address] [address], , [place]   [address] °[phone] ? [phone] ? FAX [phone] °Web site: [email or web address] ° °

## 2018-06-27 NOTE — H&P (Signed)
History of Present Illness  The patient is a 67 year old male who presents with an abdominal wall hernia. Referred by: Dr. Ferdinand Cellar Chief Complaint: Hernia  Patient is a 67 year old male comes in with a history of hyperlipidemia, hypertension with a right lower quadrant abdominal pain. Patient states is "over several months. He states that the pain is classified as discomfort to the right lower quadrant. He states he notices it more when he is lifting for working out. He is working out consists of 2-5 miles walking. He states at that time he notices some tightening to the right lower quadrant area. Patient recently underwent colonoscopy which revealed hemorrhoids however no further findings. He underwent CT scan and was found to have a right-sided spigelian hernia with portion of the appendix and fat in the area. I did review the study personally.  Patient's had no previous abdominal surgeries aside from an open umbilical hernia repair with mesh.    Past Surgical History  Tonsillectomy   Diagnostic Studies History  Colonoscopy  within last year  Allergies  Penicillins  Allergies Reconciled   Medication History  Robafen AC (100-10MG /5ML Solution, Oral) Active. Doxycycline Hyclate (100MG  Capsule, Oral) Active. Ezetimibe (10MG  Tablet, Oral) Active. HydroCHLOROthiazide (25MG  Tablet, Oral) Active. Ciprofloxacin HCl (500MG  Tablet, Oral) Active. Medications Reconciled  Social History  Alcohol use  Moderate alcohol use. Caffeine use  Coffee. Illicit drug use  Remotely quit drug use. Tobacco use  Former smoker.  Family History  Diabetes Mellitus  Father. Heart Disease  Father.  Other Problems  Asthma  Gastroesophageal Reflux Disease  High blood pressure  Hypercholesterolemia  Kidney Stone  Umbilical Hernia Repair     Review of Systems  General Not Present- Appetite Loss, Chills, Fatigue, Fever, Night Sweats, Weight Gain  and Weight Loss. Skin Not Present- Change in Wart/Mole, Dryness, Hives, Jaundice, New Lesions, Non-Healing Wounds, Rash and Ulcer. HEENT Present- Seasonal Allergies. Not Present- Earache, Hearing Loss, Hoarseness, Nose Bleed, Oral Ulcers, Ringing in the Ears, Sinus Pain, Sore Throat, Visual Disturbances, Wears glasses/contact lenses and Yellow Eyes. Respiratory Not Present- Bloody sputum, Chronic Cough, Difficulty Breathing, Snoring and Wheezing. Breast Not Present- Breast Mass, Breast Pain, Nipple Discharge and Skin Changes. Cardiovascular Not Present- Chest Pain, Difficulty Breathing Lying Down, Leg Cramps, Palpitations, Rapid Heart Rate, Shortness of Breath and Swelling of Extremities. Gastrointestinal Present- Bloody Stool and Hemorrhoids. Not Present- Abdominal Pain, Bloating, Change in Bowel Habits, Chronic diarrhea, Constipation, Difficulty Swallowing, Excessive gas, Gets full quickly at meals, Indigestion, Nausea, Rectal Pain and Vomiting. Male Genitourinary Not Present- Blood in Urine, Change in Urinary Stream, Frequency, Impotence, Nocturia, Painful Urination, Urgency and Urine Leakage. Musculoskeletal Not Present- Back Pain, Joint Pain, Joint Stiffness, Muscle Pain, Muscle Weakness and Swelling of Extremities. Neurological Not Present- Decreased Memory, Fainting, Headaches, Numbness, Seizures, Tingling, Tremor, Trouble walking and Weakness. Psychiatric Not Present- Anxiety, Bipolar, Change in Sleep Pattern, Depression, Fearful and Frequent crying. Endocrine Not Present- Cold Intolerance, Excessive Hunger, Hair Changes, Heat Intolerance, Hot flashes and New Diabetes. Hematology Not Present- Blood Thinners, Easy Bruising, Excessive bleeding, Gland problems, HIV and Persistent Infections. All other systems negative  BP (!) 148/96   Pulse 77   Temp 98.7 F (37.1 C) (Oral)   Resp 16   Ht 6' (1.829 m)   Wt 116.7 kg (257 lb 4 oz)   SpO2 98%   BMI 34.89 kg/m    Physical Exam The  physical exam findings are as follows: Note:Constitutional: No acute distress, conversant, appears stated age  Eyes: Anicteric sclerae, moist conjunctiva, no lid lag  Neck: No thyromegaly, trachea midline, no cervical lymphadenopathy  Lungs: Clear to auscultation biilaterally, normal respiratory effot  Cardiovascular: regular rate & rhythm, no murmurs, no peripheal edema, pedal pulses 2+  GI: Soft, no masses or hepatosplenomegaly, non-tender to palpation  MSK: Normal gait, no clubbing cyanosis, edema  Skin: No rashes, palpation reveals normal skin turgor  Psychiatric: Appropriate judgment and insight, oriented to person, place, and time  Abdomen Inspection Hernias - Ventral - Reducible(Right lower quadrant spigelian).    Assessment & Plan VENTRAL HERNIA WITHOUT OBSTRUCTION OR GANGRENE (K43.9) Impression: 67 year old male with a history of hypertension, hyperlipidemia, with a right lower quadrant spigelian hernia.  1. The patient will like to proceed to the operating room for laparoscopic/robotic spigelian hernia repair with mesh, possible appendectomy  2. I discussed with the patient the signs and symptoms of incarceration and strangulation and the need to proceed to the ER should they occur.  3. I discussed with the patient the risks and benefits of the procedure to include but not limited to: Infection, bleeding, damage to surrounding structures, possible need for further surgery, possible nerve pain, and possible recurrence. The patient was understanding and wishes to proceed.

## 2018-06-27 NOTE — Anesthesia Procedure Notes (Signed)
Procedure Name: Intubation Date/Time: 06/27/2018 7:41 AM Performed by: Claudia Desanctis, CRNA Pre-anesthesia Checklist: Patient identified, Emergency Drugs available, Suction available and Patient being monitored Patient Re-evaluated:Patient Re-evaluated prior to induction Oxygen Delivery Method: Circle system utilized Preoxygenation: Pre-oxygenation with 100% oxygen Induction Type: IV induction Ventilation: Mask ventilation without difficulty Laryngoscope Size: 2 and Miller Grade View: Grade I Tube type: Oral Tube size: 8.0 mm Number of attempts: 1 Airway Equipment and Method: Stylet Placement Confirmation: ETT inserted through vocal cords under direct vision,  positive ETCO2 and breath sounds checked- equal and bilateral Secured at: 23 cm Tube secured with: Tape Dental Injury: Teeth and Oropharynx as per pre-operative assessment

## 2018-06-27 NOTE — Transfer of Care (Signed)
Immediate Anesthesia Transfer of Care Note  Patient: Joseluis Alessio  Procedure(s) Performed: XI ROBOTIC ASSISTED SPIGELIAN HERNIA, (N/A Abdomen) INSERTION OF MESH (N/A Abdomen)  Patient Location: PACU  Anesthesia Type:General  Level of Consciousness: awake, alert , oriented and patient cooperative  Airway & Oxygen Therapy: Patient Spontanous Breathing and Patient connected to face mask  Post-op Assessment: Report given to RN and Post -op Vital signs reviewed and stable  Post vital signs: Reviewed and stable  Last Vitals:  Vitals Value Taken Time  BP 153/92 06/27/2018  9:38 AM  Temp    Pulse 86 06/27/2018  9:41 AM  Resp 14 06/27/2018  9:41 AM  SpO2 99 % 06/27/2018  9:41 AM  Vitals shown include unvalidated device data.  Last Pain:  Vitals:   06/27/18 0631  TempSrc:   PainSc: 0-No pain         Complications: No apparent anesthesia complications

## 2018-06-27 NOTE — Anesthesia Preprocedure Evaluation (Signed)
Anesthesia Evaluation  Patient identified by MRN, date of birth, ID band Patient awake    Reviewed: Allergy & Precautions, NPO status , Patient's Chart, lab work & pertinent test results  Airway Mallampati: II  TM Distance: >3 FB Neck ROM: Full    Dental no notable dental hx.    Pulmonary neg pulmonary ROS, asthma , former smoker,    Pulmonary exam normal breath sounds clear to auscultation       Cardiovascular hypertension, Pt. on medications negative cardio ROS Normal cardiovascular exam Rhythm:Regular Rate:Normal     Neuro/Psych negative neurological ROS  negative psych ROS   GI/Hepatic negative GI ROS, Neg liver ROS,   Endo/Other  negative endocrine ROS  Renal/GU Renal InsufficiencyRenal diseasenegative Renal ROS  negative genitourinary   Musculoskeletal negative musculoskeletal ROS (+)   Abdominal   Peds negative pediatric ROS (+)  Hematology negative hematology ROS (+)   Anesthesia Other Findings   Reproductive/Obstetrics negative OB ROS                             Anesthesia Physical Anesthesia Plan  ASA: II  Anesthesia Plan: General   Post-op Pain Management:    Induction: Intravenous  PONV Risk Score and Plan: 2 and Ondansetron and Midazolam  Airway Management Planned: Oral ETT  Additional Equipment:   Intra-op Plan:   Post-operative Plan: Extubation in OR  Informed Consent: I have reviewed the patients History and Physical, chart, labs and discussed the procedure including the risks, benefits and alternatives for the proposed anesthesia with the patient or authorized representative who has indicated his/her understanding and acceptance.   Dental advisory given  Plan Discussed with: CRNA  Anesthesia Plan Comments:         Anesthesia Quick Evaluation

## 2018-06-30 ENCOUNTER — Encounter (HOSPITAL_COMMUNITY): Payer: Self-pay | Admitting: General Surgery

## 2018-07-02 ENCOUNTER — Telehealth: Payer: Self-pay | Admitting: Gastroenterology

## 2018-07-02 NOTE — Telephone Encounter (Signed)
Spoke to patient, he is scheduled for first available banding slot.

## 2018-07-09 NOTE — OR Nursing (Signed)
Please note new changes to the Events dated for 07/01/2018 - Recovery Care Complete as well as Procedure Care Complete both need to be timed for 1346 on 07/01/2018.  Thank you.

## 2018-07-11 ENCOUNTER — Ambulatory Visit: Payer: Medicare Other | Admitting: Podiatry

## 2018-07-11 ENCOUNTER — Ambulatory Visit (INDEPENDENT_AMBULATORY_CARE_PROVIDER_SITE_OTHER): Payer: Medicare Other | Admitting: Podiatry

## 2018-07-11 ENCOUNTER — Ambulatory Visit (INDEPENDENT_AMBULATORY_CARE_PROVIDER_SITE_OTHER): Payer: Medicare Other

## 2018-07-11 ENCOUNTER — Encounter: Payer: Self-pay | Admitting: Podiatry

## 2018-07-11 ENCOUNTER — Other Ambulatory Visit: Payer: Self-pay | Admitting: Podiatry

## 2018-07-11 DIAGNOSIS — M779 Enthesopathy, unspecified: Secondary | ICD-10-CM

## 2018-07-11 DIAGNOSIS — M898X9 Other specified disorders of bone, unspecified site: Secondary | ICD-10-CM | POA: Diagnosis not present

## 2018-07-11 DIAGNOSIS — M79675 Pain in left toe(s): Secondary | ICD-10-CM

## 2018-07-11 DIAGNOSIS — M7752 Other enthesopathy of left foot: Secondary | ICD-10-CM

## 2018-07-12 DIAGNOSIS — M9905 Segmental and somatic dysfunction of pelvic region: Secondary | ICD-10-CM | POA: Diagnosis not present

## 2018-07-12 DIAGNOSIS — M9902 Segmental and somatic dysfunction of thoracic region: Secondary | ICD-10-CM | POA: Diagnosis not present

## 2018-07-12 DIAGNOSIS — M62830 Muscle spasm of back: Secondary | ICD-10-CM | POA: Diagnosis not present

## 2018-07-12 DIAGNOSIS — M9903 Segmental and somatic dysfunction of lumbar region: Secondary | ICD-10-CM | POA: Diagnosis not present

## 2018-07-14 DIAGNOSIS — M62830 Muscle spasm of back: Secondary | ICD-10-CM | POA: Diagnosis not present

## 2018-07-14 DIAGNOSIS — M9903 Segmental and somatic dysfunction of lumbar region: Secondary | ICD-10-CM | POA: Diagnosis not present

## 2018-07-14 DIAGNOSIS — M9905 Segmental and somatic dysfunction of pelvic region: Secondary | ICD-10-CM | POA: Diagnosis not present

## 2018-07-14 DIAGNOSIS — M9902 Segmental and somatic dysfunction of thoracic region: Secondary | ICD-10-CM | POA: Diagnosis not present

## 2018-07-18 DIAGNOSIS — M9902 Segmental and somatic dysfunction of thoracic region: Secondary | ICD-10-CM | POA: Diagnosis not present

## 2018-07-18 DIAGNOSIS — M9905 Segmental and somatic dysfunction of pelvic region: Secondary | ICD-10-CM | POA: Diagnosis not present

## 2018-07-18 DIAGNOSIS — M9903 Segmental and somatic dysfunction of lumbar region: Secondary | ICD-10-CM | POA: Diagnosis not present

## 2018-07-18 DIAGNOSIS — M62830 Muscle spasm of back: Secondary | ICD-10-CM | POA: Diagnosis not present

## 2018-07-21 DIAGNOSIS — M9905 Segmental and somatic dysfunction of pelvic region: Secondary | ICD-10-CM | POA: Diagnosis not present

## 2018-07-21 DIAGNOSIS — M9902 Segmental and somatic dysfunction of thoracic region: Secondary | ICD-10-CM | POA: Diagnosis not present

## 2018-07-21 DIAGNOSIS — M9903 Segmental and somatic dysfunction of lumbar region: Secondary | ICD-10-CM | POA: Diagnosis not present

## 2018-07-21 DIAGNOSIS — M62830 Muscle spasm of back: Secondary | ICD-10-CM | POA: Diagnosis not present

## 2018-07-22 NOTE — Progress Notes (Signed)
Subjective:  Patient ID: Theodore Brooks, male    DOB: 04/01/1951,  MRN: [id number]  Chief Complaint  Patient presents with  . Toe Pain    bilateral great toenails removed years ago -  tip of left toe very sore and painful -cocerned there may be a bone spur   67 y.o. male presents with the above complaint.  Reports that he had both great toes removed several years ago.  States that the tip of the toe was in the left side is very painful.    Past Medical History:  Diagnosis Date  . Abdominal hernia   . Aortic atherosclerosis ([place])   . Asthma    allergy  . 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 CRF (chronic renal failure)    Stage 2, pt denies  . GERD (gastroesophageal reflux disease)   . Hepatitis    with Mono accompanied with jaundice late teens early 20's  . History of hemorrhoids   . History of hiatal hernia    Small  . History of kidney stones   . History of rectal bleeding   . History of umbilical hernia   . Hyperlipidemia   . Hypertension   . Left hip pain 05/2018   radiating to left leg  . Squamous carcinoma    Past Surgical History:  Procedure Laterality Date  . COLONOSCOPY W/ POLYPECTOMY  03/06/2018  . INSERTION OF MESH N/A 06/27/2018   Procedure: INSERTION OF MESH;  Surgeon: Ralene Ok, MD;  Location: [facility];  Service: General;  Laterality: N/A;  . KNEE SURGERY Left    tumor removed  . SQUAMOUS CELL CARCINOMA EXCISION  12/06/2017   removal- left temple  . toe nail removal of bilateral big toes    . TONSILLECTOMY N/A   . UMBILICAL HERNIA REPAIR    . UPPER GI ENDOSCOPY    . WISDOM TOOTH EXTRACTION    . XI ROBOTIC ASSISTED VENTRAL HERNIA N/A 06/27/2018   Procedure: XI ROBOTIC ASSISTED SPIGELIAN HERNIA,;  Surgeon: Ralene Ok, MD;  Location: [facility];  Service: General;  Laterality: N/A;    Current Outpatient Medications:  .  albuterol (PROVENTIL HFA;VENTOLIN HFA) 108 (90  Base) MCG/ACT inhaler, Inhale 2 puffs into the lungs every 4 (four) hours as needed for wheezing or shortness of breath., Disp: , Rfl:  .  diphenhydrAMINE (BENADRYL) 25 MG tablet, Take 25 mg by mouth daily as needed for allergies., Disp: , Rfl:  .  esomeprazole (NEXIUM 24HR) 20 MG capsule, Take 20 mg by mouth daily. , Disp: , Rfl:  .  ezetimibe (ZETIA) 10 MG tablet, Take 10 mg by mouth daily., Disp: , Rfl:  .  hydrochlorothiazide (HYDRODIURIL) 25 MG tablet, Take 25 mg by mouth daily., Disp: , Rfl:  .  hydrocortisone cream 1 %, Apply 1 application topically daily as needed for itching., Disp: , Rfl:  .  loratadine (CLARITIN) 10 MG tablet, Take 10 mg by mouth daily as needed for allergies., Disp: , Rfl:  .  naproxen sodium (ALEVE) 220 MG tablet, Take 220 mg by mouth as needed (pain). , Disp: , Rfl:  .  neomycin-bacitracin-polymyxin (NEOSPORIN) ointment, Apply 1 application topically as needed for wound care., Disp: , Rfl:  .  sodium chloride (OCEAN) 0.65 % SOLN nasal spray, Place 1 spray into both nostrils as needed for congestion., Disp: , Rfl:  .  traMADol (ULTRAM) 50 MG tablet, Take  1 tablet (50 mg total) by mouth every 6 (six) hours as needed., Disp: 20 tablet, Rfl: 0  Current Facility-Administered Medications:  .  0.9 %  sodium chloride infusion, 500 mL, Intravenous, Once, Armbruster, Carlota Raspberry, MD  Allergies  Allergen Reactions  . Atorvastatin Calcium Anaphylaxis    Memory loss  . Influenza Vac Split Quad     Flu like symptoms   . Penicillin V Potassium     Childhood allergy Has patient had a PCN reaction causing immediate rash, facial/tongue/throat swelling, SOB or lightheadedness with hypotension: Unknown Has patient had a PCN reaction causing severe rash involving mucus membranes or skin necrosis: Unknown Has patient had a PCN reaction that required hospitalization: Unknown Has patient had a PCN reaction occurring within the last 10 years: No If all of the above answers are "NO",  then may proceed with Cephalosporin use.    Review of Systems: Negative except as noted in the HPI. Denies N/V/F/Ch. Objective:  There were no vitals filed for this visit. General AA&O x3. Normal mood and affect.  Vascular Dorsalis pedis and posterior tibial pulses  present 2+ bilaterally  Capillary refill normal to all digits. Pedal hair growth normal.  Neurologic Epicritic sensation grossly present.  Dermatologic No open lesions. Interspaces clear of maceration. Nails well groomed and normal in appearance.  Orthopedic: MMT 5/5 in dorsiflexion, plantarflexion, inversion, and eversion. Normal joint ROM without pain or crepitus.  Pain palpation of the distal tip of bilateral Great toes.  Prominent bone tip of the toe bilat   Assessment & Plan:  Patient was evaluated and treated and all questions answered.  Subungual Exostosis -Discussed etiology. -Discussed removal should symptoms worsen  Return if symptoms worsen or fail to improve.

## 2018-07-23 DIAGNOSIS — M9903 Segmental and somatic dysfunction of lumbar region: Secondary | ICD-10-CM | POA: Diagnosis not present

## 2018-07-23 DIAGNOSIS — M9905 Segmental and somatic dysfunction of pelvic region: Secondary | ICD-10-CM | POA: Diagnosis not present

## 2018-07-23 DIAGNOSIS — M9902 Segmental and somatic dysfunction of thoracic region: Secondary | ICD-10-CM | POA: Diagnosis not present

## 2018-07-23 DIAGNOSIS — M62830 Muscle spasm of back: Secondary | ICD-10-CM | POA: Diagnosis not present

## 2018-07-28 DIAGNOSIS — M9903 Segmental and somatic dysfunction of lumbar region: Secondary | ICD-10-CM | POA: Diagnosis not present

## 2018-07-28 DIAGNOSIS — M9905 Segmental and somatic dysfunction of pelvic region: Secondary | ICD-10-CM | POA: Diagnosis not present

## 2018-07-28 DIAGNOSIS — M62830 Muscle spasm of back: Secondary | ICD-10-CM | POA: Diagnosis not present

## 2018-07-28 DIAGNOSIS — M9902 Segmental and somatic dysfunction of thoracic region: Secondary | ICD-10-CM | POA: Diagnosis not present

## 2018-08-04 DIAGNOSIS — M9903 Segmental and somatic dysfunction of lumbar region: Secondary | ICD-10-CM | POA: Diagnosis not present

## 2018-08-04 DIAGNOSIS — M9905 Segmental and somatic dysfunction of pelvic region: Secondary | ICD-10-CM | POA: Diagnosis not present

## 2018-08-04 DIAGNOSIS — M62830 Muscle spasm of back: Secondary | ICD-10-CM | POA: Diagnosis not present

## 2018-08-04 DIAGNOSIS — M9902 Segmental and somatic dysfunction of thoracic region: Secondary | ICD-10-CM | POA: Diagnosis not present

## 2018-08-07 DIAGNOSIS — M5442 Lumbago with sciatica, left side: Secondary | ICD-10-CM | POA: Diagnosis not present

## 2018-08-07 DIAGNOSIS — E785 Hyperlipidemia, unspecified: Secondary | ICD-10-CM | POA: Diagnosis not present

## 2018-08-07 DIAGNOSIS — I129 Hypertensive chronic kidney disease with stage 1 through stage 4 chronic kidney disease, or unspecified chronic kidney disease: Secondary | ICD-10-CM | POA: Diagnosis not present

## 2018-08-07 DIAGNOSIS — N182 Chronic kidney disease, stage 2 (mild): Secondary | ICD-10-CM | POA: Diagnosis not present

## 2018-08-07 DIAGNOSIS — J4520 Mild intermittent asthma, uncomplicated: Secondary | ICD-10-CM | POA: Diagnosis not present

## 2018-08-11 DIAGNOSIS — M62830 Muscle spasm of back: Secondary | ICD-10-CM | POA: Diagnosis not present

## 2018-08-11 DIAGNOSIS — M9905 Segmental and somatic dysfunction of pelvic region: Secondary | ICD-10-CM | POA: Diagnosis not present

## 2018-08-11 DIAGNOSIS — M9902 Segmental and somatic dysfunction of thoracic region: Secondary | ICD-10-CM | POA: Diagnosis not present

## 2018-08-11 DIAGNOSIS — M9903 Segmental and somatic dysfunction of lumbar region: Secondary | ICD-10-CM | POA: Diagnosis not present

## 2018-08-20 ENCOUNTER — Encounter: Payer: Medicare Other | Admitting: Gastroenterology

## 2018-09-05 ENCOUNTER — Encounter: Payer: Medicare Other | Admitting: Gastroenterology

## 2018-10-13 ENCOUNTER — Encounter: Payer: Medicare Other | Admitting: Gastroenterology

## 2018-11-07 ENCOUNTER — Ambulatory Visit: Payer: Medicare Other | Admitting: Vascular Surgery

## 2018-11-07 ENCOUNTER — Ambulatory Visit (HOSPITAL_COMMUNITY)
Admission: RE | Admit: 2018-11-07 | Discharge: 2018-11-07 | Disposition: A | Discharge status: Home / Self Care | Payer: Medicare Other | Source: Ambulatory Visit | Attending: Vascular Surgery | Admitting: Vascular Surgery

## 2018-11-07 DIAGNOSIS — I728 Aneurysm of other specified arteries: Secondary | ICD-10-CM | POA: Diagnosis not present

## 2018-11-23 IMAGING — CT CT ABD-PELV W/ CM
2 of 5 series · 15 of 46 positions shown, 17 images · IV contrast (ISOVUE 300)
Comparison: None.

CLINICAL DATA: Right lower quadrant pain after exercising, chronic.

EXAM:
CT ABDOMEN AND PELVIS WITH CONTRAST
TECHNIQUE: Multidetector CT imaging of the abdomen and pelvis was performed
using the standard protocol following bolus administration of
intravenous contrast.
CONTRAST:  100 cc [id number]

[Series 2: axial st · axial · 0.88mm/px · z∈[-501,-81]mm · 12 of 98 slices shown, 14 images]
[im 7/98  soft-tissue]
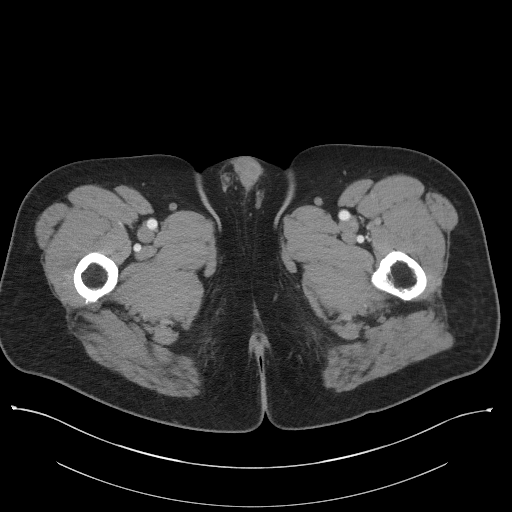
[im 7/98  bone]
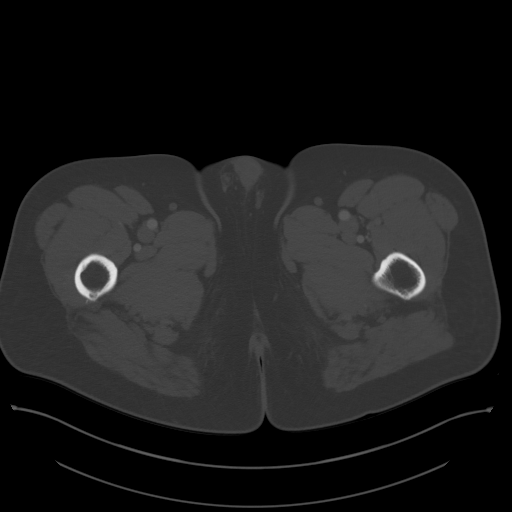
[im 13/98  soft-tissue]
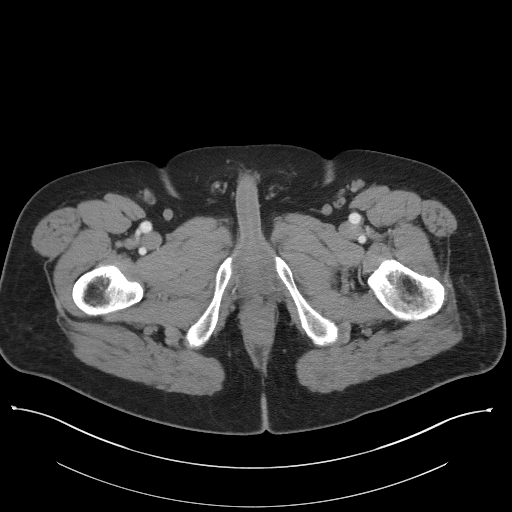
[im 25/98  soft-tissue]
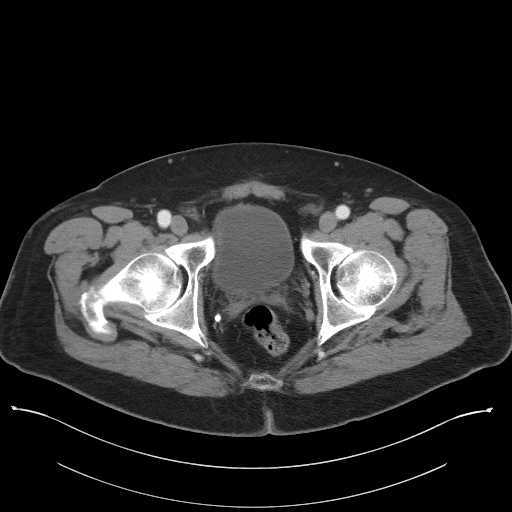
[im 31/98  soft-tissue]
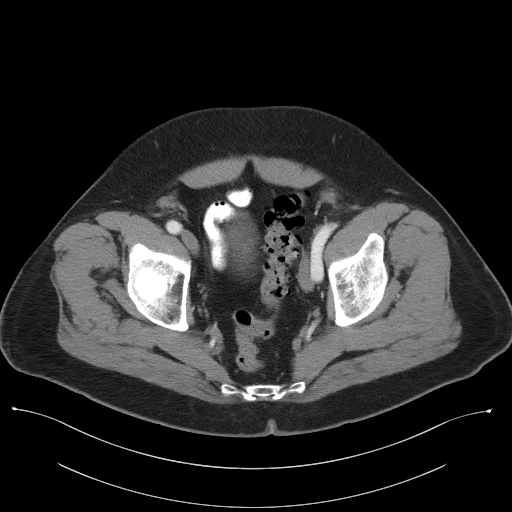
[im 37/98  soft-tissue]
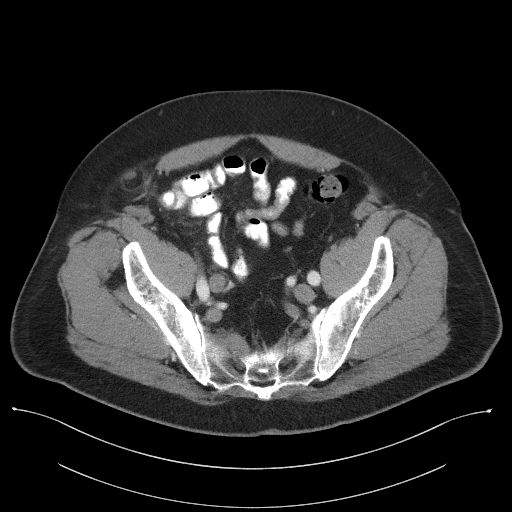
[im 43/98  soft-tissue]
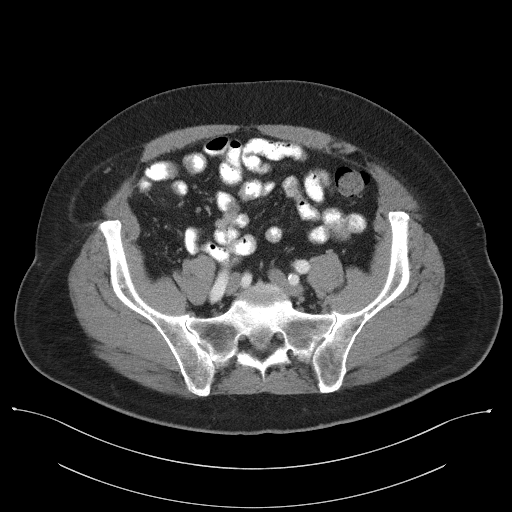
[im 55/98  soft-tissue]
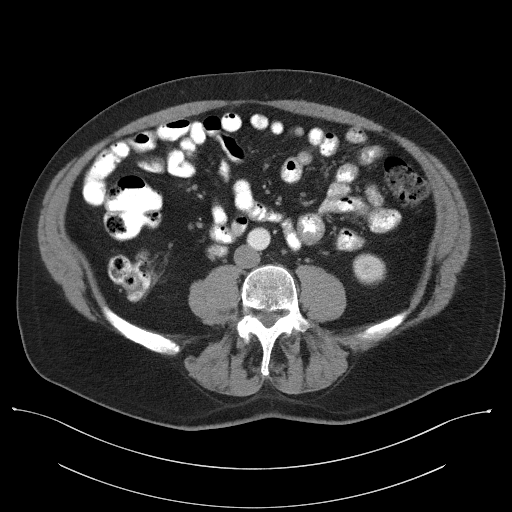
[im 61/98  soft-tissue]
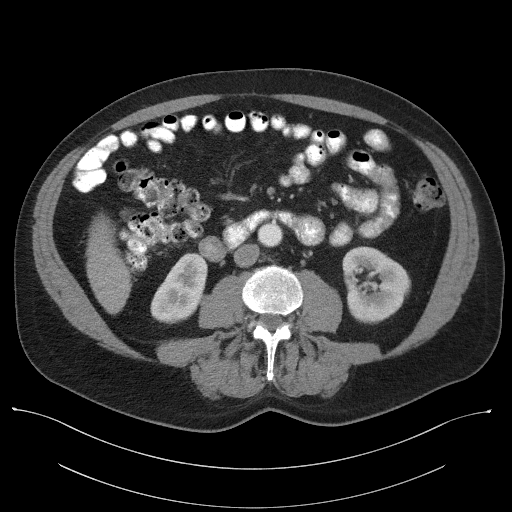
[im 67/98  soft-tissue]
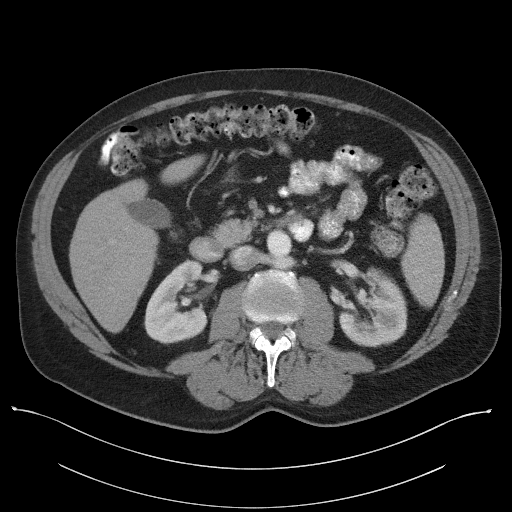
[im 67/98  bone]
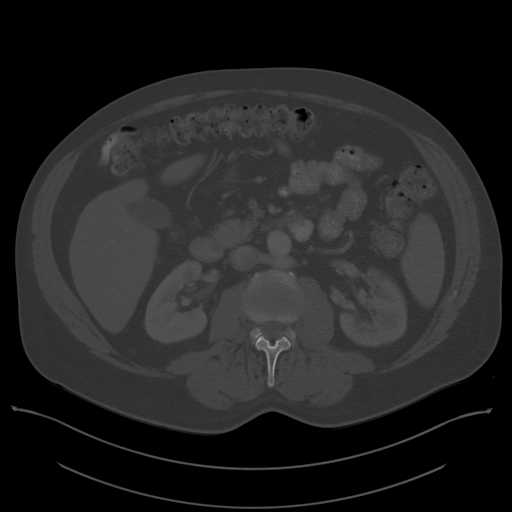
[im 73/98  soft-tissue]
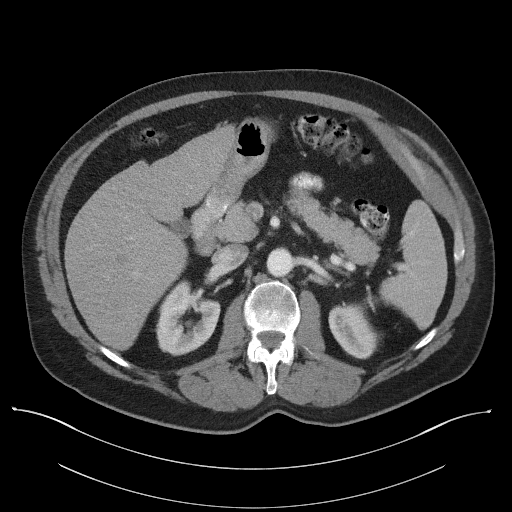
[im 85/98  soft-tissue]
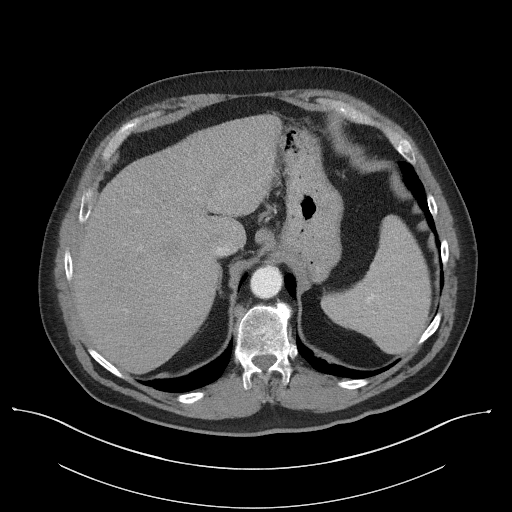
[im 91/98  soft-tissue]
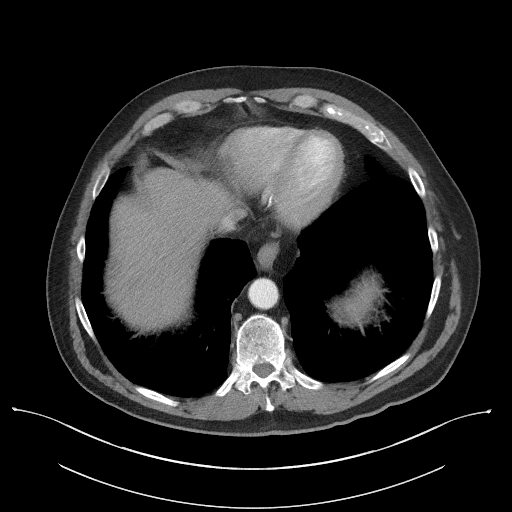

[Series 5: coronal st · coronal · 0.87mm/px · 3 of 117 slices shown]
[im 39/117  soft-tissue]
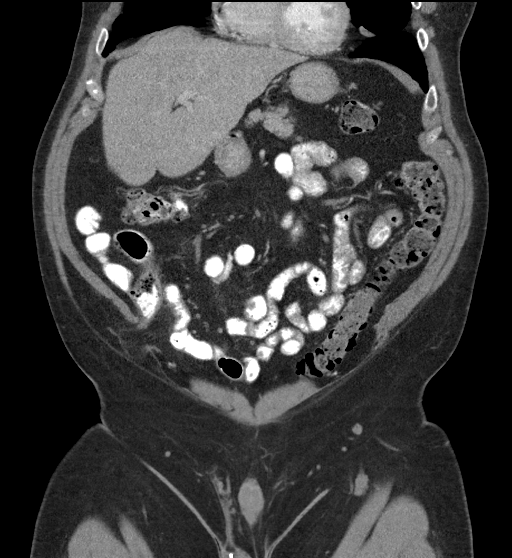
[im 52/117  soft-tissue]
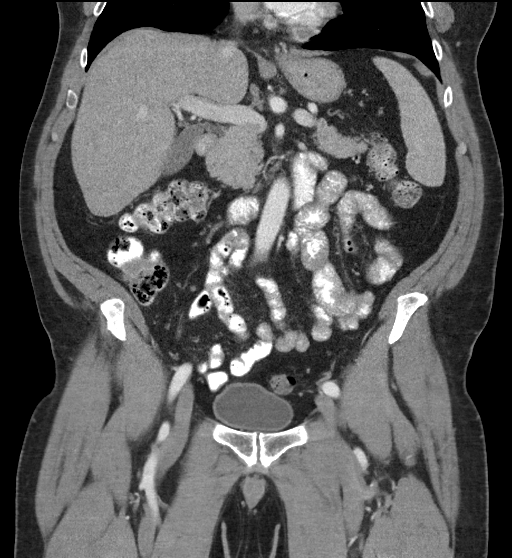
[im 65/117  soft-tissue]
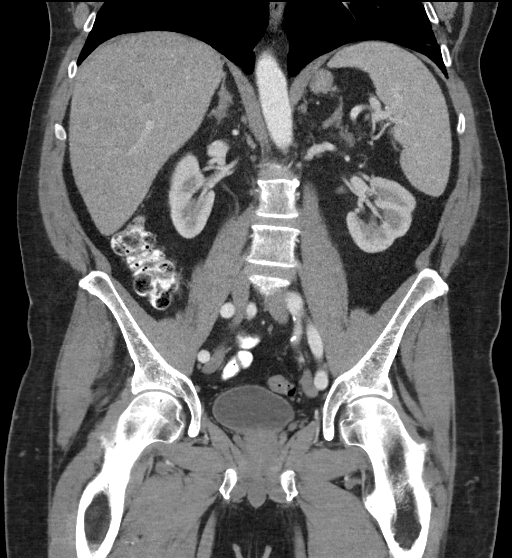

[15 of 46 positions shown; findings below may reference images not displayed]

FINDINGS: Lower chest: Mild bilateral airway thickening. Small type 1 hiatal
hernia. Faint calcification along the mitral valve.

Hepatobiliary: 4 mm hypodense lesion laterally along the right
hepatic lobe on image [DATE]. 5 mm hypodense lesion along the inferior
tip of the right hepatic lobe. Both are technically too small to
characterize, although statistically likely to be benign.

Pancreas: Unremarkable

Spleen: There is some bandlike calcification along the posterior
splenic capsule but otherwise the spleen appears normal.

Adrenals/Urinary Tract: 2 tiny hypodense right renal lesions are
technically too small to characterize, although statistically likely
to be benign. No urinary tract calculi are identified. Urinary
bladder unremarkable.

Stomach/Bowel: A right spigelian hernia contains adipose tissue and
what appears to be a partially thickened proximal appendix and
nondistended/non thickened distal appendix. It is possible that the
thickened area actually represents part of the cecum along with the
appendix, rather than simply the appendix itself. Sigmoid colon
diverticulosis present without active diverticulitis.

Vascular/Lymphatic: Mild fusiform aneurysmal dilatation of the
celiac trunk at 1.6 cm on image [DATE]. Aortoiliac atherosclerotic
vascular disease.

Reproductive: Unremarkable

Other: 1.8 by 0.9 by 2.1 cm low-density structure adjacent to the
small bowel just below the level of the hernia, this could represent
a small collection related to a prior groin hernia repair or a small
duplication cyst, or possibly a low-density lymph node.

Musculoskeletal: Lower lumbar facet arthropathy. Grade 1
degenerative anterolisthesis at L5-S1. Right Spigelian hernia as
noted above.
IMPRESSION: 1. A right Spigelian hernia peers to contain the appendix. Part of
the proximal appendix within this hernia appears abnormally
thickened, up to 1.3 cm, and could reflect low-grade appendicitis,
although some of the appearance could be due to herniation of the
cecum simulating appendiceal thickening. It would be unusual for
appendicitis to involve the proximal appendix but not the distal,
and given the chronic nature of the patient's discomfort I am
skeptical that this represents acute appendicitis. An adjacent small
soft tissue nodular structure below this hernia adjacent to the
small bowel could represent a lymph node or conceivably residuum
from a prior hernia repair.
[DATE] cm in diameter fusiform aneurysm of the celiac trunk.
Although there is some mild aortoiliac atherosclerotic vascular
disease, there is little in the way of atherosclerosis or narrowing
in the celiac trunk, and accordingly I am not certain that this
aneurysm is atherosclerotic. Non-atherosclerotic aneurysms of the
celiac trunk are higher risk than atherosclerotic aneurysms in this
size range, and interventional radiology consultation should be
considered for further workup.
3. Mild bilateral airway thickening suggesting bronchitis or
reactive airways disease.
4. Small type 1 hiatal hernia.
5.  Aortic Atherosclerosis ([id number]).

## 2018-11-25 DIAGNOSIS — Z85828 Personal history of other malignant neoplasm of skin: Secondary | ICD-10-CM | POA: Diagnosis not present

## 2018-11-25 DIAGNOSIS — L853 Xerosis cutis: Secondary | ICD-10-CM | POA: Diagnosis not present

## 2018-11-25 DIAGNOSIS — L812 Freckles: Secondary | ICD-10-CM | POA: Diagnosis not present

## 2018-11-25 DIAGNOSIS — L570 Actinic keratosis: Secondary | ICD-10-CM | POA: Diagnosis not present

## 2018-11-25 DIAGNOSIS — L821 Other seborrheic keratosis: Secondary | ICD-10-CM | POA: Diagnosis not present

## 2018-11-25 DIAGNOSIS — L918 Other hypertrophic disorders of the skin: Secondary | ICD-10-CM | POA: Diagnosis not present

## 2018-11-25 DIAGNOSIS — D1801 Hemangioma of skin and subcutaneous tissue: Secondary | ICD-10-CM | POA: Diagnosis not present

## 2018-12-03 DIAGNOSIS — I129 Hypertensive chronic kidney disease with stage 1 through stage 4 chronic kidney disease, or unspecified chronic kidney disease: Secondary | ICD-10-CM | POA: Diagnosis not present

## 2018-12-03 DIAGNOSIS — E785 Hyperlipidemia, unspecified: Secondary | ICD-10-CM | POA: Diagnosis not present

## 2018-12-10 DIAGNOSIS — I700 Atherosclerosis of aorta: Secondary | ICD-10-CM | POA: Diagnosis not present

## 2018-12-10 DIAGNOSIS — J449 Chronic obstructive pulmonary disease, unspecified: Secondary | ICD-10-CM | POA: Diagnosis not present

## 2018-12-10 DIAGNOSIS — I129 Hypertensive chronic kidney disease with stage 1 through stage 4 chronic kidney disease, or unspecified chronic kidney disease: Secondary | ICD-10-CM | POA: Diagnosis not present

## 2018-12-10 DIAGNOSIS — N182 Chronic kidney disease, stage 2 (mild): Secondary | ICD-10-CM | POA: Diagnosis not present

## 2018-12-10 DIAGNOSIS — E785 Hyperlipidemia, unspecified: Secondary | ICD-10-CM | POA: Diagnosis not present

## 2018-12-10 DIAGNOSIS — I10 Essential (primary) hypertension: Secondary | ICD-10-CM | POA: Diagnosis not present

## 2018-12-10 DIAGNOSIS — K219 Gastro-esophageal reflux disease without esophagitis: Secondary | ICD-10-CM | POA: Diagnosis not present

## 2018-12-10 DIAGNOSIS — M25562 Pain in left knee: Secondary | ICD-10-CM | POA: Diagnosis not present

## 2018-12-10 DIAGNOSIS — M25512 Pain in left shoulder: Secondary | ICD-10-CM | POA: Diagnosis not present

## 2018-12-12 ENCOUNTER — Ambulatory Visit (INDEPENDENT_AMBULATORY_CARE_PROVIDER_SITE_OTHER): Payer: Medicare Other | Admitting: Vascular Surgery

## 2018-12-12 ENCOUNTER — Other Ambulatory Visit: Payer: Self-pay

## 2018-12-12 ENCOUNTER — Encounter: Payer: Self-pay | Admitting: Vascular Surgery

## 2018-12-12 VITALS — BP 116/81 | HR 76 | Temp 97.6°F | Resp 16 | Ht 71.5 in | Wt 265.0 lb

## 2018-12-12 DIAGNOSIS — I728 Aneurysm of other specified arteries: Secondary | ICD-10-CM | POA: Diagnosis not present

## 2018-12-12 NOTE — Progress Notes (Signed)
Patient ID: Theodore Brooks, male   DOB: [birth year], 67 y.o.   MRN: [id number]  Reason for Consult: Aneurysm (celiac artery aneurysm)   Referred by Merrilee Seashore, MD  Subjective:     HPI:  Theodore Brooks is a 67 y.o. male with history of abdominal hernia secondary to lifting and is now been repaired.  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  He is asymptomatic from this.  He had ultrasound performed prior to this.  He has no known risk factors other than being a former smoker.  He is remaining active with both his church and traveling now that he is retired.  Past Medical History:  Diagnosis Date  . Abdominal hernia   . Aortic atherosclerosis ([place])   . Asthma    allergy  . Celiac artery aneurysm ([place]) 03/17/2018   less than 2 cm, this is a baseline will recheck ultrasound Nov or Dec 2019  . CRF (chronic renal failure)    Stage 2, pt denies  . GERD (gastroesophageal reflux disease)   . Hepatitis    with Mono accompanied with jaundice late teens early 20's  . History of hemorrhoids   . History of hiatal hernia    Small  . History of kidney stones   . History of rectal bleeding   . History of umbilical hernia   . Hyperlipidemia   . Hypertension   . Left hip pain 05/2018   radiating to left leg  . Squamous carcinoma    Family History  Problem Relation Age of Onset  . Heart disease Father    Past Surgical History:  Procedure Laterality Date  . COLONOSCOPY W/ POLYPECTOMY  03/06/2018  . INSERTION OF MESH N/A 06/27/2018   Procedure: INSERTION OF MESH;  Surgeon: Ralene Ok, MD;  Location: [facility];  Service: General;  Laterality: N/A;  . KNEE SURGERY Left    tumor removed  . SQUAMOUS CELL CARCINOMA EXCISION  12/06/2017   removal- left temple  . toe nail removal of bilateral big toes    . TONSILLECTOMY N/A   . UMBILICAL HERNIA REPAIR    . UPPER GI ENDOSCOPY    . WISDOM TOOTH EXTRACTION    . XI ROBOTIC ASSISTED VENTRAL HERNIA N/A 06/27/2018     Procedure: XI ROBOTIC ASSISTED SPIGELIAN HERNIA,;  Surgeon: Ralene Ok, MD;  Location: [facility];  Service: General;  Laterality: N/A;    Short Social History:  Social History   Tobacco Use  . Smoking status: Former Smoker    Packs/day: 0.50    Years: 6.00    Pack years: 3.00    Types: Cigarettes    Last attempt to quit: 1975    Years since quitting: 44.9  . Smokeless tobacco: Never Used  Substance Use Topics  . Alcohol use: Yes    Comment: 2 to 3 a week    Allergies  Allergen Reactions  . Atorvastatin Calcium Anaphylaxis    Memory loss  . Influenza Vac Split Quad     Flu like symptoms   . Penicillin V Potassium     Childhood allergy Has patient had a PCN reaction causing immediate rash, facial/tongue/throat swelling, SOB or lightheadedness with hypotension: Unknown Has patient had a PCN reaction causing severe rash involving mucus membranes or skin necrosis: Unknown Has patient had a PCN reaction that required hospitalization: Unknown Has patient had a PCN reaction occurring within the last 10 years: No If all of the above answers are "NO", then  may proceed with Cephalosporin use.     Current Outpatient Medications  Medication Sig Dispense Refill  . albuterol (PROVENTIL HFA;VENTOLIN HFA) 108 (90 Base) MCG/ACT inhaler Inhale 2 puffs into the lungs every 4 (four) hours as needed for wheezing or shortness of breath.    . diphenhydrAMINE (BENADRYL) 25 MG tablet Take 25 mg by mouth daily as needed for allergies.    Marland Kitchen esomeprazole (NEXIUM 24HR) 20 MG capsule Take 20 mg by mouth daily.     Marland Kitchen ezetimibe (ZETIA) 10 MG tablet Take 10 mg by mouth daily.    . hydrochlorothiazide (HYDRODIURIL) 25 MG tablet Take 25 mg by mouth daily.    . hydrocortisone cream 1 % Apply 1 application topically daily as needed for itching.    . loratadine (CLARITIN) 10 MG tablet Take 10 mg by mouth daily as needed for allergies.    . naproxen sodium (ALEVE) 220 MG tablet Take 220 mg by mouth as  needed (pain).     Marland Kitchen neomycin-bacitracin-polymyxin (NEOSPORIN) ointment Apply 1 application topically as needed for wound care.    . sodium chloride (OCEAN) 0.65 % SOLN nasal spray Place 1 spray into both nostrils as needed for congestion.    . traMADol (ULTRAM) 50 MG tablet Take 1 tablet (50 mg total) by mouth every 6 (six) hours as needed. (Patient not taking: Reported on 12/12/2018) 20 tablet 0   Current Facility-Administered Medications  Medication Dose Route Frequency Provider Last Rate Last Dose  . 0.9 %  sodium chloride infusion  500 mL Intravenous Once Armbruster, Carlota Raspberry, MD        Review of Systems  Constitutional:  Constitutional negative. HENT: HENT negative.  Eyes: Eyes negative.  Respiratory: Respiratory negative.  Cardiovascular: Cardiovascular negative.  GI: Gastrointestinal negative.  Musculoskeletal: Musculoskeletal negative.  Skin: Skin negative.  Neurological: Neurological negative. Hematologic: Hematologic/lymphatic negative.  Psychiatric: Psychiatric negative.        Objective:  Objective   Vitals:   12/12/18 0911  BP: 116/81  Pulse: 76  Resp: 16  Temp: 97.6 F (36.4 C)  TempSrc: Oral  SpO2: 95%  Weight: 265 lb (120.2 kg)  Height: 5' 11.5" (1.816 m)   Body mass index is 36.44 kg/m.  Physical Exam HENT:     Head: Normocephalic.  Eyes:     Pupils: Pupils are equal, round, and reactive to light.  Neck:     Musculoskeletal: Normal range of motion and neck supple.     Vascular: No carotid bruit.  Cardiovascular:     Rate and Rhythm: Normal rate and regular rhythm.     Pulses:          Carotid pulses are 2+ on the right side and 2+ on the left side.      Radial pulses are 2+ on the right side and 2+ on the left side.  Abdominal:     Palpations: Abdomen is soft.  Musculoskeletal: Normal range of motion.     Right lower leg: No edema.     Left lower leg: No edema.  Skin:    General: Skin is warm and dry.  Neurological:     General: No  focal deficit present.     Mental Status: He is alert.  Psychiatric:        Mood and Affect: Mood normal.        Behavior: Behavior normal.        Thought Content: Thought content normal.     Data: I reviewed his mesenteric  duplex that could not visualize his his visceral vessels given bowel gas and patient size.  We did review his CT scan together again demonstrated 1.5 cm celiac axis.     Assessment/Plan:     67 year old male with known celiac artery aneurysmal disease that is asymptomatic and was found incidentally.  Given that we cannot see on duplex we will get another CT scan in a couple months.  It is unchanged at that time I will likely release him from my care.  I discussed with him the expected outcomes he demonstrates very good understanding.     Waynetta Sandy MD [facility]

## 2019-01-13 ENCOUNTER — Other Ambulatory Visit: Payer: Self-pay

## 2019-01-13 DIAGNOSIS — I728 Aneurysm of other specified arteries: Secondary | ICD-10-CM

## 2019-02-12 ENCOUNTER — Ambulatory Visit
Admission: RE | Admit: 2019-02-12 | Discharge: 2019-02-12 | Disposition: A | Discharge status: Home / Self Care | Payer: Medicare Other | Source: Ambulatory Visit | Attending: Vascular Surgery | Admitting: Vascular Surgery

## 2019-02-12 DIAGNOSIS — I728 Aneurysm of other specified arteries: Secondary | ICD-10-CM | POA: Diagnosis not present

## 2019-02-12 DIAGNOSIS — I774 Celiac artery compression syndrome: Secondary | ICD-10-CM | POA: Diagnosis not present

## 2019-02-12 MED ORDER — IOPAMIDOL (ISOVUE-300) INJECTION 61%
125.0000 mL | Freq: Once | INTRAVENOUS | Status: AC | PRN
  Administered 2019-02-12: 125 mL via INTRAVENOUS

## 2019-02-20 ENCOUNTER — Ambulatory Visit: Payer: Medicare Other | Admitting: Vascular Surgery

## 2019-02-20 ENCOUNTER — Ambulatory Visit (INDEPENDENT_AMBULATORY_CARE_PROVIDER_SITE_OTHER): Payer: Medicare Other | Admitting: Vascular Surgery

## 2019-02-20 ENCOUNTER — Other Ambulatory Visit: Payer: Self-pay

## 2019-02-20 ENCOUNTER — Encounter: Payer: Self-pay | Admitting: Vascular Surgery

## 2019-02-20 VITALS — BP 122/83 | HR 90 | Temp 98.1°F | Resp 18 | Ht 72.0 in | Wt 269.5 lb

## 2019-02-20 DIAGNOSIS — I728 Aneurysm of other specified arteries: Secondary | ICD-10-CM

## 2019-02-20 NOTE — Progress Notes (Signed)
Patient ID: Theodore Brooks, male   DOB: 10/29/1951, 68 y.o.   MRN: [id number]  Reason for Consult: Aneurysm   Referred by Merrilee Seashore, MD  Subjective:     HPI:  Theodore Brooks is a 68 y.o. male found to have spit [place] hernia that is now been repaired.  On CT evaluation was noted to have a celiac artery aneurysm and does have a family history of 2 deaths from aneurysms that he thinks were abdominal.  He is a non-smoker but is hypertensive and does have hypercholesterolemia.  Has no new abdominal pain or back pain does not take blood thinners.  Weight is stable since last visit.  Celiac artery could not be visualized on duplex and so CT scan was repeated.  Past Medical History:  Diagnosis Date  . Abdominal hernia   . Aortic atherosclerosis ([place])   . Asthma    allergy  . 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 CRF (chronic renal failure)    Stage 2, pt denies  . GERD (gastroesophageal reflux disease)   . Hepatitis    with Mono accompanied with jaundice late teens early 20's  . History of hemorrhoids   . History of hiatal hernia    Small  . History of kidney stones   . History of rectal bleeding   . History of umbilical hernia   . Hyperlipidemia   . Hypertension   . Left hip pain 05/2018   radiating to left leg  . Squamous carcinoma    Family History  Problem Relation Age of Onset  . Heart disease Father    Past Surgical History:  Procedure Laterality Date  . COLONOSCOPY W/ POLYPECTOMY  03/06/2018  . INSERTION OF MESH N/A 06/27/2018   Procedure: INSERTION OF MESH;  Surgeon: Ralene Ok, MD;  Location: [facility];  Service: General;  Laterality: N/A;  . KNEE SURGERY Left    tumor removed  . SQUAMOUS CELL CARCINOMA EXCISION  12/06/2017   removal- left temple  . toe nail removal of bilateral big toes    . TONSILLECTOMY N/A   . UMBILICAL HERNIA REPAIR    . UPPER GI ENDOSCOPY    .  WISDOM TOOTH EXTRACTION    . XI ROBOTIC ASSISTED VENTRAL HERNIA N/A 06/27/2018   Procedure: XI ROBOTIC ASSISTED SPIGELIAN HERNIA,;  Surgeon: Ralene Ok, MD;  Location: [facility];  Service: General;  Laterality: N/A;    Short Social History:  Social History   Tobacco Use  . Smoking status: Former Smoker    Packs/day: 0.50    Years: 6.00    Pack years: 3.00    Types: Cigarettes    Last attempt to quit: 1975    Years since quitting: 45.1  . Smokeless tobacco: Never Used  Substance Use Topics  . Alcohol use: Yes    Comment: 2 to 3 a week    Allergies  Allergen Reactions  . Atorvastatin Calcium Anaphylaxis    Memory loss  . Influenza Vac Split Quad     Flu like symptoms   . Penicillin V Potassium     Childhood allergy Has patient had a PCN reaction causing immediate rash, facial/tongue/throat swelling, SOB or lightheadedness with hypotension: Unknown Has patient had a PCN reaction causing severe rash involving mucus membranes or skin necrosis: Unknown Has patient had a PCN reaction that required hospitalization: Unknown Has patient had a PCN reaction occurring  within the last 10 years: No If all of the above answers are "NO", then may proceed with Cephalosporin use.     Current Outpatient Medications  Medication Sig Dispense Refill  . esomeprazole (NEXIUM 24HR) 20 MG capsule Take 20 mg by mouth daily.     Marland Kitchen ezetimibe (ZETIA) 10 MG tablet Take 10 mg by mouth daily.    . hydrochlorothiazide (HYDRODIURIL) 25 MG tablet Take 25 mg by mouth daily.    Marland Kitchen albuterol (PROVENTIL HFA;VENTOLIN HFA) 108 (90 Base) MCG/ACT inhaler Inhale 2 puffs into the lungs every 4 (four) hours as needed for wheezing or shortness of breath.    . diphenhydrAMINE (BENADRYL) 25 MG tablet Take 25 mg by mouth daily as needed for allergies.    . hydrocortisone cream 1 % Apply 1 application topically daily as needed for itching.    . loratadine (CLARITIN) 10 MG tablet Take 10 mg by mouth daily as needed for  allergies.    Marland Kitchen lovastatin (MEVACOR) 20 MG tablet TK 1 T PO QD WAC    . meloxicam (MOBIC) 15 MG tablet TK 1 T PO QD    . naproxen sodium (ALEVE) 220 MG tablet Take 220 mg by mouth as needed (pain).     Marland Kitchen neomycin-bacitracin-polymyxin (NEOSPORIN) ointment Apply 1 application topically as needed for wound care.    . sodium chloride (OCEAN) 0.65 % SOLN nasal spray Place 1 spray into both nostrils as needed for congestion.    . traMADol (ULTRAM) 50 MG tablet Take 1 tablet (50 mg total) by mouth every 6 (six) hours as needed. (Patient not taking: Reported on 12/12/2018) 20 tablet 0   Current Facility-Administered Medications  Medication Dose Route Frequency Provider Last Rate Last Dose  . 0.9 %  sodium chloride infusion  500 mL Intravenous Once Armbruster, Carlota Raspberry, MD        Review of Systems  Constitutional:  Constitutional negative. HENT: HENT negative.  Eyes: Eyes negative.  Respiratory: Respiratory negative.  Cardiovascular: Cardiovascular negative.  GI: Gastrointestinal negative.  Musculoskeletal: Positive for joint pain.  Skin: Skin negative.  Neurological: Neurological negative.       Objective:  Objective   Vitals:   02/20/19 1057  BP: 122/83  Pulse: 90  Resp: 18  Temp: 98.1 F (36.7 C)  TempSrc: Oral  SpO2: 94%  Weight: 269 lb 8.2 oz (122.3 kg)  Height: 6' (1.829 m)   Body mass index is 36.55 kg/m.  Physical Exam Constitutional:      Appearance: Normal appearance.  HENT:     Head: Normocephalic.  Cardiovascular:     Rate and Rhythm: Normal rate.     Pulses:          Carotid pulses are 2+ on the right side and 2+ on the left side.      Radial pulses are 2+ on the right side and 2+ on the left side.       Femoral pulses are 2+ on the right side and 2+ on the left side. Pulmonary:     Effort: Pulmonary effort is normal.  Abdominal:     Palpations: Abdomen is soft. There is no mass.  Musculoskeletal: Normal range of motion.        General: No swelling.    Skin:    General: Skin is warm and dry.     Capillary Refill: Capillary refill takes less than 2 seconds.  Neurological:     General: No focal deficit present.  Psychiatric:  Mood and Affect: Mood normal.        Behavior: Behavior normal.        Thought Content: Thought content normal.        Judgment: Judgment normal.     Data: IMPRESSION: 1. No change in enlargement of the celiac axis measuring 1.4 cm in caliber.  2. Interval repair of a previously seen right spigelian hernia.  3. Chronic and incidental findings as detailed above.  I reviewed the patient's CT scan with him detailing his stable celiac axis measuring approximately 1.4 cm in diameter.     Assessment/Plan:     68 year old male follows up 1 year after initial CT scan demonstrating dilatation of his celiac axis which is stable on recent CT scan.  We have reviewed these images together and compared them.  We will see him in 2 years with repeat CT scan given that this could not be visualized with duplex.  I have counseled him on the signs and symptoms of issues which would be back or abdominal pain he demonstrates good understanding peer     Waynetta Sandy MD [facility]

## 2019-04-27 DIAGNOSIS — M1712 Unilateral primary osteoarthritis, left knee: Secondary | ICD-10-CM | POA: Diagnosis not present

## 2019-04-27 DIAGNOSIS — M25562 Pain in left knee: Secondary | ICD-10-CM | POA: Diagnosis not present

## 2019-04-27 DIAGNOSIS — M7062 Trochanteric bursitis, left hip: Secondary | ICD-10-CM | POA: Diagnosis not present

## 2019-04-27 DIAGNOSIS — M25552 Pain in left hip: Secondary | ICD-10-CM | POA: Diagnosis not present

## 2019-10-08 DIAGNOSIS — I700 Atherosclerosis of aorta: Secondary | ICD-10-CM | POA: Diagnosis not present

## 2019-10-09 DIAGNOSIS — E782 Mixed hyperlipidemia: Secondary | ICD-10-CM | POA: Diagnosis not present

## 2019-10-09 DIAGNOSIS — I129 Hypertensive chronic kidney disease with stage 1 through stage 4 chronic kidney disease, or unspecified chronic kidney disease: Secondary | ICD-10-CM | POA: Diagnosis not present

## 2019-10-09 DIAGNOSIS — J301 Allergic rhinitis due to pollen: Secondary | ICD-10-CM | POA: Diagnosis not present

## 2019-10-09 DIAGNOSIS — F419 Anxiety disorder, unspecified: Secondary | ICD-10-CM | POA: Diagnosis not present

## 2019-10-09 DIAGNOSIS — N182 Chronic kidney disease, stage 2 (mild): Secondary | ICD-10-CM | POA: Diagnosis not present

## 2019-10-09 DIAGNOSIS — Z23 Encounter for immunization: Secondary | ICD-10-CM | POA: Diagnosis not present

## 2019-10-09 DIAGNOSIS — J449 Chronic obstructive pulmonary disease, unspecified: Secondary | ICD-10-CM | POA: Diagnosis not present

## 2019-10-09 DIAGNOSIS — R5383 Other fatigue: Secondary | ICD-10-CM | POA: Diagnosis not present

## 2019-10-09 DIAGNOSIS — E669 Obesity, unspecified: Secondary | ICD-10-CM | POA: Diagnosis not present

## 2019-10-09 DIAGNOSIS — Z7189 Other specified counseling: Secondary | ICD-10-CM | POA: Diagnosis not present

## 2019-10-09 DIAGNOSIS — I700 Atherosclerosis of aorta: Secondary | ICD-10-CM | POA: Diagnosis not present

## 2019-10-09 DIAGNOSIS — R7303 Prediabetes: Secondary | ICD-10-CM | POA: Diagnosis not present

## 2019-11-25 ENCOUNTER — Other Ambulatory Visit: Payer: Self-pay

## 2019-12-08 DIAGNOSIS — L853 Xerosis cutis: Secondary | ICD-10-CM | POA: Diagnosis not present

## 2019-12-08 DIAGNOSIS — Z85828 Personal history of other malignant neoplasm of skin: Secondary | ICD-10-CM | POA: Diagnosis not present

## 2019-12-08 DIAGNOSIS — L821 Other seborrheic keratosis: Secondary | ICD-10-CM | POA: Diagnosis not present

## 2019-12-08 DIAGNOSIS — L570 Actinic keratosis: Secondary | ICD-10-CM | POA: Diagnosis not present

## 2019-12-08 DIAGNOSIS — L918 Other hypertrophic disorders of the skin: Secondary | ICD-10-CM | POA: Diagnosis not present

## 2019-12-08 DIAGNOSIS — D1801 Hemangioma of skin and subcutaneous tissue: Secondary | ICD-10-CM | POA: Diagnosis not present

## 2020-02-13 DIAGNOSIS — Z23 Encounter for immunization: Secondary | ICD-10-CM | POA: Diagnosis not present

## 2020-03-12 DIAGNOSIS — Z23 Encounter for immunization: Secondary | ICD-10-CM | POA: Diagnosis not present

## 2020-04-04 ENCOUNTER — Encounter: Payer: Self-pay | Admitting: Physician Assistant

## 2020-04-19 ENCOUNTER — Encounter: Payer: Self-pay | Admitting: Physician Assistant

## 2020-04-19 ENCOUNTER — Ambulatory Visit (INDEPENDENT_AMBULATORY_CARE_PROVIDER_SITE_OTHER): Payer: Medicare Other | Admitting: Physician Assistant

## 2020-04-19 VITALS — BP 130/90 | HR 68 | Temp 97.6°F | Ht 72.0 in | Wt 261.8 lb

## 2020-04-19 DIAGNOSIS — K625 Hemorrhage of anus and rectum: Secondary | ICD-10-CM | POA: Diagnosis not present

## 2020-04-19 DIAGNOSIS — Z86010 Personal history of colonic polyps: Secondary | ICD-10-CM | POA: Insufficient documentation

## 2020-04-19 DIAGNOSIS — K648 Other hemorrhoids: Secondary | ICD-10-CM

## 2020-04-19 MED ORDER — HYDROCORTISONE ACETATE 25 MG RE SUPP: 25.0000 mg | Freq: Every day | RECTAL | 8 refills | Status: AC

## 2020-04-19 NOTE — Patient Instructions (Signed)
If you are age 70 or older, your body mass index should be between 23-30. Your Body mass index is 35.51 kg/m. If this is out of the aforementioned range listed, please consider follow up with your Primary Care Provider.  If you are age 70 or younger, your body mass index should be between 19-25. Your Body mass index is 35.51 kg/m. If this is out of the aformentioned range listed, please consider follow up with your Primary Care Provider.   We have sent the following medications to your pharmacy for you to pick up at your convenience: Anusol Suppositories  Continue daily Fiber.  Call if increase in frequency.  Follow up with Dr. Havery Moros as needed.  You have been given hemorrhoid banding information  Thank you for choosing me and [facility].   Amy Esterwood, PA-C

## 2020-04-19 NOTE — Progress Notes (Signed)
Subjective:    Patient ID: Theodore Brooks, male    DOB: 1951/06/12, 69 y.o.   MRN: [id number]  HPI Theodore Brooks is a pleasant 69 year old white male, known to Dr. Havery Moros who comes in today with complaints of intermittent hemorrhoidal bleeding. Patient was seen in 2019 for colonoscopy, and was found to have 3 small polyps all 5 mm or less, and moderate internal hemorrhoids.  Path on the polyps, one tubular adenoma and the others were benign lymphoid polyps. Pt  has history of hypertension, chronic kidney disease stage II and history of prior celiac artery aneurysm. He says he has been having intermittent bleeding over the past couple of years.  He says he may have some red bleeding noted on the tissue and in the commode for couple of days in a row and then may go for a week or a month without any symptoms.  He had had some bleeding when he called for this appointment but has not noted any over the past 2 weeks.  Bleeding is not associated with any rectal pain or discomfort.  He has no complaints of constipation or straining with bowel movements.  He has not had any prolapse of hemorrhoids that he is aware of. No current complaints of abdominal discomfort or changes in bowel habits.  Review of Systems Pertinent positive and negative review of systems were noted in the above HPI section.  All other review of systems was otherwise negative.  Outpatient Encounter Medications as of 04/19/2020  Medication Sig  . albuterol (PROVENTIL HFA;VENTOLIN HFA) 108 (90 Base) MCG/ACT inhaler Inhale 2 puffs into the lungs every 4 (four) hours as needed for wheezing or shortness of breath.  . diphenhydrAMINE (BENADRYL) 25 MG tablet Take 25 mg by mouth daily as needed for allergies.  Marland Kitchen esomeprazole (NEXIUM 24HR) 20 MG capsule Take 20 mg by mouth daily.   Marland Kitchen ezetimibe (ZETIA) 10 MG tablet Take 10 mg by mouth daily.  . hydrochlorothiazide (HYDRODIURIL) 25 MG tablet Take 25 mg by mouth daily.  . hydrocortisone cream 1 %  Apply 1 application topically daily as needed for itching.  . loratadine (CLARITIN) 10 MG tablet Take 10 mg by mouth daily as needed for allergies.  . naproxen sodium (ALEVE) 220 MG tablet Take 220 mg by mouth as needed (pain).   Marland Kitchen neomycin-bacitracin-polymyxin (NEOSPORIN) ointment Apply 1 application topically as needed for wound care.  . sodium chloride (OCEAN) 0.65 % SOLN nasal spray Place 1 spray into both nostrils as needed for congestion.  . hydrocortisone (ANUSOL-HC) 25 MG suppository Place 1 suppository (25 mg total) rectally at bedtime. Take for 5 days as needed for rectal bleeding  . [DISCONTINUED] lovastatin (MEVACOR) 20 MG tablet TK 1 T PO QD WAC  . [DISCONTINUED] meloxicam (MOBIC) 15 MG tablet TK 1 T PO QD   Facility-Administered Encounter Medications as of 04/19/2020  Medication  . 0.9 %  sodium chloride infusion   Allergies  Allergen Reactions  . Atorvastatin Calcium Anaphylaxis    Memory loss  . Influenza Vac Split Quad     Flu like symptoms   . Penicillin V Potassium     Childhood allergy Has patient had a PCN reaction causing immediate rash, facial/tongue/throat swelling, SOB or lightheadedness with hypotension: Unknown Has patient had a PCN reaction causing severe rash involving mucus membranes or skin necrosis: Unknown Has patient had a PCN reaction that required hospitalization: Unknown Has patient had a PCN reaction occurring within the last 10 years: No If all of  the above answers are "NO", then may proceed with Cephalosporin use.    Patient Active Problem List   Diagnosis Date Noted  . Bleeding internal hemorrhoids 04/19/2020  . Hx of adenomatous colonic polyps 04/19/2020   Social History   Socioeconomic History  . Marital status: Married    Spouse name: Not on file  . Number of children: 1  . Years of education: Not on file  . Highest education level: Not on file  Occupational History  . Occupation: Retired  Tobacco Use  . Smoking status: Former  Smoker    Packs/day: 0.50    Years: 6.00    Pack years: 3.00    Types: Cigarettes    Quit date: 1975    Years since quitting: 46.3  . Smokeless tobacco: Never Used  Substance and Sexual Activity  . Alcohol use: Yes    Comment: 2 to 3 a week  . Drug use: No  . Sexual activity: Not on file  Other Topics Concern  . Not on file  Social History Narrative  . Not on file   Social Determinants of Health   Financial Resource Strain:   . Difficulty of Paying Living Expenses:   Food Insecurity:   . Worried About Charity fundraiser in the Last Year:   . Arboriculturist in the Last Year:   Transportation Needs:   . Film/video editor (Medical):   Marland Kitchen Lack of Transportation (Non-Medical):   Physical Activity:   . Days of Exercise per Week:   . Minutes of Exercise per Session:   Stress:   . Feeling of Stress :   Social Connections:   . Frequency of Communication with Friends and Family:   . Frequency of Social Gatherings with Friends and Family:   . Attends Religious Services:   . Active Member of Clubs or Organizations:   . Attends Archivist Meetings:   Marland Kitchen Marital Status:   Intimate Partner Violence:   . Fear of Current or Ex-Partner:   . Emotionally Abused:   Marland Kitchen Physically Abused:   . Sexually Abused:     Mr. Blood family history includes Heart disease in his father.      Objective:    Vitals:   04/19/20 0901  BP: 130/90  Pulse: 68  Temp: 97.6 F (36.4 C)    Physical Exam Well-developed well-nourished older Wmale in no acute distress.  Height, [id number], BMI 35  HEENT; nontraumatic normocephalic, EOMI, PER R LA, sclera anicteric. Oropharynx; not examined Neck; supple, no JVD Cardiovascular; regular rate and rhythm with S1-S2, no murmur rub or gallop Pulmonary; Clear bilaterally Abdomen; soft, nontender, nondistended, no palpable mass or hepatosplenomegaly, bowel sounds are active Rectal; 1 external hemorrhoid, somewhat edematous but not  thrombosed, and internal hemorrhoids on digital exam, nonprolapsing Skin; benign exam, no jaundice rash or appreciable lesions Extremities; no clubbing cyanosis or edema skin warm and dry Neuro/Psych; alert and oriented x4, grossly nonfocal mood and affect appropriate       Assessment & Plan:   #27 69 year old white male with intermittent low-volume hematochezia secondary to previously documented internal hemorrhoids. No associated rectal pain or discomfort, no constipation or straining.  #2 history of adenomatous colon polyp-up-to-date with colon screening last done 2019, indicated for 5-year interval follow-up. #3 diverticulosis  Plan; Continue daily fiber supplement and liberal water intake. Start Anusol [facility] suppositories 1 per rectum nightly for 5 to 7 days after episodes of rectal bleeding. We discussed in office hemorrhoidal  banding procedure, which is an option.  He would like to try suppositories first as he is not having ongoing daily symptoms.  He would like to consider banding should he have increase in frequency or volume of bleeding in the future. He will follow-up with Dr. Havery Moros or myself as needed  Alfredia Ferguson PA-C 04/19/2020   Cc: Merrilee Seashore, MD

## 2020-04-19 NOTE — Progress Notes (Signed)
Agree with assessment and plan as outlined.  

## 2020-10-12 DIAGNOSIS — Z23 Encounter for immunization: Secondary | ICD-10-CM | POA: Diagnosis not present

## 2020-11-16 DIAGNOSIS — Z23 Encounter for immunization: Secondary | ICD-10-CM | POA: Diagnosis not present

## 2020-12-12 DIAGNOSIS — N182 Chronic kidney disease, stage 2 (mild): Secondary | ICD-10-CM | POA: Diagnosis not present

## 2020-12-12 DIAGNOSIS — E782 Mixed hyperlipidemia: Secondary | ICD-10-CM | POA: Diagnosis not present

## 2020-12-12 DIAGNOSIS — I700 Atherosclerosis of aorta: Secondary | ICD-10-CM | POA: Diagnosis not present

## 2020-12-12 DIAGNOSIS — I129 Hypertensive chronic kidney disease with stage 1 through stage 4 chronic kidney disease, or unspecified chronic kidney disease: Secondary | ICD-10-CM | POA: Diagnosis not present

## 2020-12-12 DIAGNOSIS — Z125 Encounter for screening for malignant neoplasm of prostate: Secondary | ICD-10-CM | POA: Diagnosis not present

## 2020-12-12 DIAGNOSIS — I10 Essential (primary) hypertension: Secondary | ICD-10-CM | POA: Diagnosis not present

## 2020-12-19 DIAGNOSIS — I10 Essential (primary) hypertension: Secondary | ICD-10-CM | POA: Diagnosis not present

## 2020-12-19 DIAGNOSIS — J4520 Mild intermittent asthma, uncomplicated: Secondary | ICD-10-CM | POA: Diagnosis not present

## 2020-12-19 DIAGNOSIS — I700 Atherosclerosis of aorta: Secondary | ICD-10-CM | POA: Diagnosis not present

## 2020-12-19 DIAGNOSIS — Z0000 Encounter for general adult medical examination without abnormal findings: Secondary | ICD-10-CM | POA: Diagnosis not present

## 2020-12-19 DIAGNOSIS — I129 Hypertensive chronic kidney disease with stage 1 through stage 4 chronic kidney disease, or unspecified chronic kidney disease: Secondary | ICD-10-CM | POA: Diagnosis not present

## 2020-12-19 DIAGNOSIS — J449 Chronic obstructive pulmonary disease, unspecified: Secondary | ICD-10-CM | POA: Diagnosis not present

## 2020-12-19 DIAGNOSIS — E785 Hyperlipidemia, unspecified: Secondary | ICD-10-CM | POA: Diagnosis not present

## 2020-12-19 DIAGNOSIS — R7303 Prediabetes: Secondary | ICD-10-CM | POA: Diagnosis not present

## 2020-12-19 DIAGNOSIS — N182 Chronic kidney disease, stage 2 (mild): Secondary | ICD-10-CM | POA: Diagnosis not present

## 2021-01-10 ENCOUNTER — Other Ambulatory Visit: Payer: Medicare Other

## 2021-01-10 ENCOUNTER — Other Ambulatory Visit: Payer: Self-pay

## 2021-01-10 DIAGNOSIS — Z20822 Contact with and (suspected) exposure to covid-19: Secondary | ICD-10-CM

## 2021-01-14 LAB — NOVEL CORONAVIRUS, NAA: SARS-CoV-2, NAA: NOT DETECTED

## 2021-05-23 DIAGNOSIS — M79642 Pain in left hand: Secondary | ICD-10-CM | POA: Diagnosis not present

## 2021-05-23 DIAGNOSIS — M720 Palmar fascial fibromatosis [Dupuytren]: Secondary | ICD-10-CM | POA: Diagnosis not present

## 2021-08-14 DIAGNOSIS — I129 Hypertensive chronic kidney disease with stage 1 through stage 4 chronic kidney disease, or unspecified chronic kidney disease: Secondary | ICD-10-CM | POA: Diagnosis not present

## 2021-08-14 DIAGNOSIS — E782 Mixed hyperlipidemia: Secondary | ICD-10-CM | POA: Diagnosis not present

## 2021-08-21 DIAGNOSIS — N182 Chronic kidney disease, stage 2 (mild): Secondary | ICD-10-CM | POA: Diagnosis not present

## 2021-08-21 DIAGNOSIS — E785 Hyperlipidemia, unspecified: Secondary | ICD-10-CM | POA: Diagnosis not present

## 2021-08-21 DIAGNOSIS — J4520 Mild intermittent asthma, uncomplicated: Secondary | ICD-10-CM | POA: Diagnosis not present

## 2021-08-21 DIAGNOSIS — J449 Chronic obstructive pulmonary disease, unspecified: Secondary | ICD-10-CM | POA: Diagnosis not present

## 2021-08-21 DIAGNOSIS — I10 Essential (primary) hypertension: Secondary | ICD-10-CM | POA: Diagnosis not present

## 2021-08-21 DIAGNOSIS — R7303 Prediabetes: Secondary | ICD-10-CM | POA: Diagnosis not present

## 2021-08-21 DIAGNOSIS — I129 Hypertensive chronic kidney disease with stage 1 through stage 4 chronic kidney disease, or unspecified chronic kidney disease: Secondary | ICD-10-CM | POA: Diagnosis not present

## 2021-08-21 DIAGNOSIS — E782 Mixed hyperlipidemia: Secondary | ICD-10-CM | POA: Diagnosis not present

## 2021-08-21 DIAGNOSIS — I700 Atherosclerosis of aorta: Secondary | ICD-10-CM | POA: Diagnosis not present

## 2021-09-01 DIAGNOSIS — M720 Palmar fascial fibromatosis [Dupuytren]: Secondary | ICD-10-CM | POA: Diagnosis not present

## 2022-01-10 DIAGNOSIS — D2371 Other benign neoplasm of skin of right lower limb, including hip: Secondary | ICD-10-CM | POA: Diagnosis not present

## 2022-01-10 DIAGNOSIS — L821 Other seborrheic keratosis: Secondary | ICD-10-CM | POA: Diagnosis not present

## 2022-01-10 DIAGNOSIS — Z85828 Personal history of other malignant neoplasm of skin: Secondary | ICD-10-CM | POA: Diagnosis not present

## 2022-01-10 DIAGNOSIS — L820 Inflamed seborrheic keratosis: Secondary | ICD-10-CM | POA: Diagnosis not present

## 2022-01-10 DIAGNOSIS — L723 Sebaceous cyst: Secondary | ICD-10-CM | POA: Diagnosis not present

## 2022-01-10 DIAGNOSIS — L308 Other specified dermatitis: Secondary | ICD-10-CM | POA: Diagnosis not present

## 2022-01-10 DIAGNOSIS — L570 Actinic keratosis: Secondary | ICD-10-CM | POA: Diagnosis not present

## 2023-01-16 DIAGNOSIS — D1801 Hemangioma of skin and subcutaneous tissue: Secondary | ICD-10-CM | POA: Diagnosis not present

## 2023-01-16 DIAGNOSIS — L720 Epidermal cyst: Secondary | ICD-10-CM | POA: Diagnosis not present

## 2023-01-16 DIAGNOSIS — L918 Other hypertrophic disorders of the skin: Secondary | ICD-10-CM | POA: Diagnosis not present

## 2023-01-16 DIAGNOSIS — L821 Other seborrheic keratosis: Secondary | ICD-10-CM | POA: Diagnosis not present

## 2023-01-16 DIAGNOSIS — Z85828 Personal history of other malignant neoplasm of skin: Secondary | ICD-10-CM | POA: Diagnosis not present

## 2023-01-16 DIAGNOSIS — L812 Freckles: Secondary | ICD-10-CM | POA: Diagnosis not present

## 2023-02-28 ENCOUNTER — Encounter: Payer: Self-pay | Admitting: Gastroenterology

## 2023-05-09 DIAGNOSIS — M720 Palmar fascial fibromatosis [Dupuytren]: Secondary | ICD-10-CM | POA: Diagnosis not present

## 2023-06-07 ENCOUNTER — Other Ambulatory Visit: Payer: Self-pay | Admitting: Orthopedic Surgery

## 2023-06-07 DIAGNOSIS — G8918 Other acute postprocedural pain: Secondary | ICD-10-CM | POA: Diagnosis not present

## 2023-06-07 DIAGNOSIS — M720 Palmar fascial fibromatosis [Dupuytren]: Secondary | ICD-10-CM | POA: Diagnosis not present

## 2023-06-11 DIAGNOSIS — M25642 Stiffness of left hand, not elsewhere classified: Secondary | ICD-10-CM | POA: Diagnosis not present

## 2023-06-17 DIAGNOSIS — M25642 Stiffness of left hand, not elsewhere classified: Secondary | ICD-10-CM | POA: Diagnosis not present

## 2023-07-09 DIAGNOSIS — M25642 Stiffness of left hand, not elsewhere classified: Secondary | ICD-10-CM | POA: Diagnosis not present

## 2023-07-12 DIAGNOSIS — M25642 Stiffness of left hand, not elsewhere classified: Secondary | ICD-10-CM | POA: Diagnosis not present

## 2023-07-16 DIAGNOSIS — M25642 Stiffness of left hand, not elsewhere classified: Secondary | ICD-10-CM | POA: Diagnosis not present

## 2023-07-18 DIAGNOSIS — M25642 Stiffness of left hand, not elsewhere classified: Secondary | ICD-10-CM | POA: Diagnosis not present

## 2023-07-26 DIAGNOSIS — M25642 Stiffness of left hand, not elsewhere classified: Secondary | ICD-10-CM | POA: Diagnosis not present

## 2023-07-30 DIAGNOSIS — M25642 Stiffness of left hand, not elsewhere classified: Secondary | ICD-10-CM | POA: Diagnosis not present

## 2023-08-08 DIAGNOSIS — M25642 Stiffness of left hand, not elsewhere classified: Secondary | ICD-10-CM | POA: Diagnosis not present

## 2023-08-09 DIAGNOSIS — X58XXXA Exposure to other specified factors, initial encounter: Secondary | ICD-10-CM | POA: Diagnosis not present

## 2023-08-09 DIAGNOSIS — I10 Essential (primary) hypertension: Secondary | ICD-10-CM | POA: Diagnosis not present

## 2023-08-09 DIAGNOSIS — Z23 Encounter for immunization: Secondary | ICD-10-CM | POA: Diagnosis not present

## 2023-08-09 DIAGNOSIS — S81811A Laceration without foreign body, right lower leg, initial encounter: Secondary | ICD-10-CM | POA: Diagnosis not present

## 2023-08-14 DIAGNOSIS — M1812 Unilateral primary osteoarthritis of first carpometacarpal joint, left hand: Secondary | ICD-10-CM | POA: Diagnosis not present

## 2023-08-14 DIAGNOSIS — G5602 Carpal tunnel syndrome, left upper limb: Secondary | ICD-10-CM | POA: Diagnosis not present

## 2023-08-14 DIAGNOSIS — M720 Palmar fascial fibromatosis [Dupuytren]: Secondary | ICD-10-CM | POA: Diagnosis not present

## 2023-08-19 DIAGNOSIS — M25642 Stiffness of left hand, not elsewhere classified: Secondary | ICD-10-CM | POA: Diagnosis not present

## 2023-09-04 DIAGNOSIS — I10 Essential (primary) hypertension: Secondary | ICD-10-CM | POA: Diagnosis not present

## 2023-09-04 DIAGNOSIS — F439 Reaction to severe stress, unspecified: Secondary | ICD-10-CM | POA: Diagnosis not present

## 2023-09-04 DIAGNOSIS — F1090 Alcohol use, unspecified, uncomplicated: Secondary | ICD-10-CM | POA: Diagnosis not present

## 2023-09-04 DIAGNOSIS — E782 Mixed hyperlipidemia: Secondary | ICD-10-CM | POA: Diagnosis not present

## 2023-09-04 DIAGNOSIS — E6609 Other obesity due to excess calories: Secondary | ICD-10-CM | POA: Diagnosis not present

## 2023-09-04 DIAGNOSIS — Z6833 Body mass index (BMI) 33.0-33.9, adult: Secondary | ICD-10-CM | POA: Diagnosis not present

## 2023-09-04 DIAGNOSIS — Z125 Encounter for screening for malignant neoplasm of prostate: Secondary | ICD-10-CM | POA: Diagnosis not present

## 2023-09-06 DIAGNOSIS — I10 Essential (primary) hypertension: Secondary | ICD-10-CM | POA: Diagnosis not present

## 2023-09-06 DIAGNOSIS — E782 Mixed hyperlipidemia: Secondary | ICD-10-CM | POA: Diagnosis not present

## 2023-09-11 DIAGNOSIS — H2513 Age-related nuclear cataract, bilateral: Secondary | ICD-10-CM | POA: Diagnosis not present

## 2023-09-11 DIAGNOSIS — H18593 Other hereditary corneal dystrophies, bilateral: Secondary | ICD-10-CM | POA: Diagnosis not present

## 2023-09-11 DIAGNOSIS — H52203 Unspecified astigmatism, bilateral: Secondary | ICD-10-CM | POA: Diagnosis not present

## 2023-09-11 DIAGNOSIS — D3132 Benign neoplasm of left choroid: Secondary | ICD-10-CM | POA: Diagnosis not present

## 2023-09-11 DIAGNOSIS — H5203 Hypermetropia, bilateral: Secondary | ICD-10-CM | POA: Diagnosis not present

## 2023-09-11 DIAGNOSIS — H524 Presbyopia: Secondary | ICD-10-CM | POA: Diagnosis not present

## 2023-09-11 DIAGNOSIS — H43813 Vitreous degeneration, bilateral: Secondary | ICD-10-CM | POA: Diagnosis not present

## 2023-12-11 DIAGNOSIS — H5203 Hypermetropia, bilateral: Secondary | ICD-10-CM | POA: Diagnosis not present

## 2023-12-11 DIAGNOSIS — D3132 Benign neoplasm of left choroid: Secondary | ICD-10-CM | POA: Diagnosis not present

## 2023-12-11 DIAGNOSIS — H18593 Other hereditary corneal dystrophies, bilateral: Secondary | ICD-10-CM | POA: Diagnosis not present

## 2023-12-11 DIAGNOSIS — H52203 Unspecified astigmatism, bilateral: Secondary | ICD-10-CM | POA: Diagnosis not present

## 2023-12-11 DIAGNOSIS — H2513 Age-related nuclear cataract, bilateral: Secondary | ICD-10-CM | POA: Diagnosis not present

## 2023-12-11 DIAGNOSIS — H524 Presbyopia: Secondary | ICD-10-CM | POA: Diagnosis not present

## 2023-12-11 DIAGNOSIS — H25013 Cortical age-related cataract, bilateral: Secondary | ICD-10-CM | POA: Diagnosis not present

## 2023-12-11 DIAGNOSIS — H43813 Vitreous degeneration, bilateral: Secondary | ICD-10-CM | POA: Diagnosis not present

## 2024-01-13 DIAGNOSIS — H25011 Cortical age-related cataract, right eye: Secondary | ICD-10-CM | POA: Diagnosis not present

## 2024-01-13 DIAGNOSIS — H2511 Age-related nuclear cataract, right eye: Secondary | ICD-10-CM | POA: Diagnosis not present

## 2024-01-13 DIAGNOSIS — H25811 Combined forms of age-related cataract, right eye: Secondary | ICD-10-CM | POA: Diagnosis not present

## 2024-01-13 DIAGNOSIS — Z961 Presence of intraocular lens: Secondary | ICD-10-CM | POA: Diagnosis not present

## 2024-01-13 DIAGNOSIS — H52221 Regular astigmatism, right eye: Secondary | ICD-10-CM | POA: Diagnosis not present

## 2024-02-04 DIAGNOSIS — I8311 Varicose veins of right lower extremity with inflammation: Secondary | ICD-10-CM | POA: Diagnosis not present

## 2024-02-04 DIAGNOSIS — L308 Other specified dermatitis: Secondary | ICD-10-CM | POA: Diagnosis not present

## 2024-02-04 DIAGNOSIS — Z85828 Personal history of other malignant neoplasm of skin: Secondary | ICD-10-CM | POA: Diagnosis not present

## 2024-02-04 DIAGNOSIS — I872 Venous insufficiency (chronic) (peripheral): Secondary | ICD-10-CM | POA: Diagnosis not present

## 2024-02-04 DIAGNOSIS — I8312 Varicose veins of left lower extremity with inflammation: Secondary | ICD-10-CM | POA: Diagnosis not present

## 2024-02-04 DIAGNOSIS — D1801 Hemangioma of skin and subcutaneous tissue: Secondary | ICD-10-CM | POA: Diagnosis not present

## 2024-02-04 DIAGNOSIS — L723 Sebaceous cyst: Secondary | ICD-10-CM | POA: Diagnosis not present

## 2024-02-04 DIAGNOSIS — L821 Other seborrheic keratosis: Secondary | ICD-10-CM | POA: Diagnosis not present

## 2024-02-04 DIAGNOSIS — L570 Actinic keratosis: Secondary | ICD-10-CM | POA: Diagnosis not present

## 2024-03-09 DIAGNOSIS — H52222 Regular astigmatism, left eye: Secondary | ICD-10-CM | POA: Diagnosis not present

## 2024-03-09 DIAGNOSIS — H25012 Cortical age-related cataract, left eye: Secondary | ICD-10-CM | POA: Diagnosis not present

## 2024-03-09 DIAGNOSIS — H2512 Age-related nuclear cataract, left eye: Secondary | ICD-10-CM | POA: Diagnosis not present

## 2024-03-09 DIAGNOSIS — Z961 Presence of intraocular lens: Secondary | ICD-10-CM | POA: Diagnosis not present

## 2024-03-09 DIAGNOSIS — H25812 Combined forms of age-related cataract, left eye: Secondary | ICD-10-CM | POA: Diagnosis not present

## 2024-05-22 DIAGNOSIS — J019 Acute sinusitis, unspecified: Secondary | ICD-10-CM | POA: Diagnosis not present

## 2024-05-22 DIAGNOSIS — Z8709 Personal history of other diseases of the respiratory system: Secondary | ICD-10-CM | POA: Diagnosis not present

## 2024-09-18 DIAGNOSIS — D3132 Benign neoplasm of left choroid: Secondary | ICD-10-CM | POA: Diagnosis not present

## 2024-09-18 DIAGNOSIS — H43813 Vitreous degeneration, bilateral: Secondary | ICD-10-CM | POA: Diagnosis not present

## 2024-09-18 DIAGNOSIS — H35033 Hypertensive retinopathy, bilateral: Secondary | ICD-10-CM | POA: Diagnosis not present

## 2024-09-18 DIAGNOSIS — H35372 Puckering of macula, left eye: Secondary | ICD-10-CM | POA: Diagnosis not present

## 2024-09-18 DIAGNOSIS — Z961 Presence of intraocular lens: Secondary | ICD-10-CM | POA: Diagnosis not present

## 2024-10-19 DIAGNOSIS — Z961 Presence of intraocular lens: Secondary | ICD-10-CM | POA: Diagnosis not present

## 2024-10-19 DIAGNOSIS — H43813 Vitreous degeneration, bilateral: Secondary | ICD-10-CM | POA: Diagnosis not present

## 2024-10-19 DIAGNOSIS — H04123 Dry eye syndrome of bilateral lacrimal glands: Secondary | ICD-10-CM | POA: Diagnosis not present

## 2024-10-19 DIAGNOSIS — H35033 Hypertensive retinopathy, bilateral: Secondary | ICD-10-CM | POA: Diagnosis not present

## 2024-10-19 DIAGNOSIS — D3132 Benign neoplasm of left choroid: Secondary | ICD-10-CM | POA: Diagnosis not present

## 2024-10-19 DIAGNOSIS — H35372 Puckering of macula, left eye: Secondary | ICD-10-CM | POA: Diagnosis not present

## 2025-01-28 ENCOUNTER — Ambulatory Visit (INDEPENDENT_AMBULATORY_CARE_PROVIDER_SITE_OTHER): Payer: Self-pay | Admitting: Mental Health

## 2025-01-28 DIAGNOSIS — F329 Major depressive disorder, single episode, unspecified: Secondary | ICD-10-CM | POA: Diagnosis not present

## 2025-01-28 NOTE — Progress Notes (Signed)
 [facility] Counsel[facility] Initial Adult Exam  Name: Theodore Brooks Date: 01/28/2025 MRN: [id number] DOB: 1951/10/12 PCP: Verdia Lombard, MD  Time spent:  50 minutes  Reason for Visit [place] Problem:  He reports extreme anxiety over the past 3 years, his wife has spent excessively (hundreds of thousands of dollars). He has been carrying resentment, he has been more depressed. He has coped with etoh at night to de-stress and get to sleep. He stopped last month. He use to walk but stopped due to his mood change.  His wife has struggled with identity theft, has needed 4 phones over the past 2 years.  She has taken his credit card info to pay her other bills, seeked out money from her son and his wife. Their house is full of cluttered, he thinks his wife struggles with hording.  Often feels like I'm done but less so over the last few weeks. His wife is limited now on access to funds. He stated  her spending patterns have increased over time but have been problematic for years. He likes to travel but she does not, in part due to her migraines and fibromyalgia.  He expressed some hopeless feelings at times, questions whether he wants to continue his marriage but ultimately identified reasons that he wants to work on his marriage. He wants to change his perceptions of how he views the stressors in his life, namely the resentment he continues to hold onto related to his wife and her behaviors that have caused him stress. Recommended he return to therapy in 2 weeks.   Mental Status Exam:    Appearance:    Casual     Behavior:   Appropriate  Motor:   WNL  Speech/Language:    Clear and Coherent  Affect:   Full range   Mood:   Euthymic  Thought process:   Logical, linear, goal directed  Thought content:     WNL  Sensory/Perceptual disturbances:     none  Orientation:   x4  Attention:   Good  Concentration:   Good  Memory:   Intact  Fund of knowledge:    Consistent with age and development   Insight:     Good  Judgment:    Good  Impulse Control:   Good     Reported Symptoms:  depressed mood, sleep problems, low motivation, anhedonia, some hopeless feelings   Risk Assessment: Danger to Self:  No Self-injurious Behavior: No Danger to Others: No Duty to Warn:no Physical Aggression / Violence:No  Access to Firearms a concern: No  Gang Involvement:No  Patient / guardian was educated about steps to take if suicide or homicide risk level increases between visits: yes While future psychiatric events cannot be accurately predicted, the patient does not currently require acute inpatient psychiatric care and does not currently meet [place]  involuntary commitment criteria.   Substance Abuse History: Current substance abuse: none  Past Psychiatric History:   Outpatient Providers: none History of Psych Hospitalization: No  Psychological Testing: none  Abuse History: Victim - none stated   Family History:  Raised by both parents. Younger brother-age 68  Family History  Problem Relation Age of Onset   Heart disease Father     Living situation: the patient lives with their spouse  Sexual Orientation:  Straight  Relationship Status: married  Name of spouse / other: Slater             If a parent, number of children / ages:  son- Will age  31  Support Systems; wife  Financial Stress:  Yes   Income/Employment/Disability: retired  Financial Planner: No   Educational History: Education: college graduate  Religion/Sprituality/World View:   Christian   Recreation/Hobbies: walking, traveling  Stressors:Financial difficulties  , marital  Strengths:  Supportive Relationships and Family  Barriers:  none   Legal History: Pending legal issue / charges: none  Medical History/Surgical History: Past Medical History:  Diagnosis Date   Abdominal hernia    Aortic atherosclerosis    Asthma    allergy   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   less than 2 cm,  this is a baseline will recheck ultrasound Nov or Dec 2019   CRF (chronic renal failure)    Stage 2, pt denies   GERD (gastroesophageal reflux disease)    Hepatitis    with Mono accompanied with jaundice late teens early 20's   History of hemorrhoids    History of hiatal hernia    Small   History of kidney stones    History of rectal bleeding    History of umbilical hernia    Hyperlipidemia    Hypertension    Left hip pain 05/2018   radiating to left leg   Squamous carcinoma     Past Surgical History:  Procedure Laterality Date   COLONOSCOPY W/ POLYPECTOMY  03/06/2018   INSERTION OF MESH N/A 06/27/2018   Procedure: INSERTION OF MESH;  Surgeon: Rubin Calamity, MD;  Location: [facility];  Service: General;  Laterality: N/A;   KNEE SURGERY Left    tumor removed   SQUAMOUS CELL CARCINOMA EXCISION  12/06/2017   removal- left temple   toe nail removal of bilateral big toes     TONSILLECTOMY N/A    UMBILICAL HERNIA REPAIR     UPPER GI ENDOSCOPY     WISDOM TOOTH EXTRACTION     XI ROBOTIC ASSISTED VENTRAL HERNIA N/A 06/27/2018   Procedure: XI ROBOTIC ASSISTED SPIGELIAN HERNIA,;  Surgeon: Rubin Calamity, MD;  Location: [facility];  Service: General;  Laterality: N/A;    Medications: Current Outpatient Medications  Medication Sig Dispense Refill   albuterol (PROVENTIL HFA;VENTOLIN HFA) 108 (90 Base) MCG/ACT inhaler Inhale 2 puffs into the lungs every 4 (four) hours as needed for wheezing or shortness of breath.     diphenhydrAMINE (BENADRYL) 25 MG tablet Take 25 mg by mouth daily as needed for allergies.     esomeprazole (NEXIUM 24HR) 20 MG capsule Take 20 mg by mouth daily.      ezetimibe (ZETIA) 10 MG tablet Take 10 mg by mouth daily.     hydrochlorothiazide (HYDRODIURIL) 25 MG tablet Take 25 mg by mouth daily.     hydrocortisone  (ANUSOL -HC) 25 MG suppository Place 1 suppository (25 mg total) rectally at bedtime. Take for 5 days as needed for rectal bleeding 12 suppository 8    hydrocortisone  cream 1 % Apply 1 application topically daily as needed for itching.     loratadine (CLARITIN) 10 MG tablet Take 10 mg by mouth daily as needed for allergies.     naproxen sodium (ALEVE) 220 MG tablet Take 220 mg by mouth as needed (pain).      neomycin-bacitracin-polymyxin (NEOSPORIN) ointment Apply 1 application topically as needed for wound care.     sodium chloride  (OCEAN) 0.65 % SOLN nasal spray Place 1 spray into both nostrils as needed for congestion.     Current Facility-Administered Medications  Medication Dose Route Frequency Provider Last Rate Last Admin   0.9 %  sodium  chloride infusion  500 mL Intravenous Once Armbruster, Elspeth SQUIBB, MD        Allergies[1]  Diagnoses:    ICD-10-CM   1. Reactive depression  F32.9       Plan of Care:  TBD  Lonni Fischer, [facility]        [1]  Allergies Allergen Reactions   Atorvastatin Calcium Anaphylaxis    Memory loss   Influenza Vac Split Quad     Flu like symptoms    Penicillin V Potassium     Childhood allergy Has patient had a PCN reaction causing immediate rash, facial/tongue/throat swelling, SOB or lightheadedness with hypotension: Unknown Has patient had a PCN reaction causing severe rash involving mucus membranes or skin necrosis: Unknown Has patient had a PCN reaction that required hospitalization: Unknown Has patient had a PCN reaction occurring within the last 10 years: No If all of the above answers are NO, then may proceed with Cephalosporin use.

## 2025-02-16 ENCOUNTER — Ambulatory Visit: Admitting: Mental Health

## 2025-03-02 ENCOUNTER — Ambulatory Visit: Admitting: Mental Health
